# Patient Record
Sex: Male | Born: 1995 | Race: Black or African American | Hispanic: No | Marital: Single | Smoking: Never smoker
Health system: Southern US, Community
[De-identification: ages and names within clinical notes are randomized; demographics above are authoritative.]

---

## 2019-11-19 ENCOUNTER — Other Ambulatory Visit: Payer: Self-pay

## 2019-11-19 ENCOUNTER — Encounter (HOSPITAL_COMMUNITY): Payer: Self-pay | Admitting: *Deleted

## 2019-11-19 ENCOUNTER — Inpatient Hospital Stay (HOSPITAL_COMMUNITY)
Admission: EM | Admit: 2019-11-19 | Discharge: 2019-11-24 | DRG: 566 | Disposition: A | Discharge status: Home / Self Care | Payer: BC Managed Care – PPO | Attending: Internal Medicine | Admitting: Internal Medicine

## 2019-11-19 DIAGNOSIS — R823 Hemoglobinuria: Secondary | ICD-10-CM | POA: Diagnosis present

## 2019-11-19 DIAGNOSIS — Z20822 Contact with and (suspected) exposure to covid-19: Secondary | ICD-10-CM | POA: Diagnosis present

## 2019-11-19 DIAGNOSIS — T796XXA Traumatic ischemia of muscle, initial encounter: Principal | ICD-10-CM | POA: Diagnosis present

## 2019-11-19 DIAGNOSIS — X503XXA Overexertion from repetitive movements, initial encounter: Secondary | ICD-10-CM

## 2019-11-19 DIAGNOSIS — R7401 Elevation of levels of liver transaminase levels: Secondary | ICD-10-CM | POA: Diagnosis present

## 2019-11-19 DIAGNOSIS — X500XXA Overexertion from strenuous movement or load, initial encounter: Secondary | ICD-10-CM

## 2019-11-19 DIAGNOSIS — R7989 Other specified abnormal findings of blood chemistry: Secondary | ICD-10-CM

## 2019-11-19 DIAGNOSIS — M79604 Pain in right leg: Secondary | ICD-10-CM

## 2019-11-19 DIAGNOSIS — Z807 Family history of other malignant neoplasms of lymphoid, hematopoietic and related tissues: Secondary | ICD-10-CM

## 2019-11-19 DIAGNOSIS — M6282 Rhabdomyolysis: Secondary | ICD-10-CM | POA: Diagnosis not present

## 2019-11-19 DIAGNOSIS — R809 Proteinuria, unspecified: Secondary | ICD-10-CM | POA: Diagnosis present

## 2019-11-19 DIAGNOSIS — Y9359 Activity, other involving other sports and athletics played individually: Secondary | ICD-10-CM

## 2019-11-19 DIAGNOSIS — R824 Acetonuria: Secondary | ICD-10-CM | POA: Diagnosis present

## 2019-11-19 LAB — COMPREHENSIVE METABOLIC PANEL
ALT: 168 U/L — ABNORMAL HIGH (ref 0–44)
AST: 848 U/L — ABNORMAL HIGH (ref 15–41)
Albumin: 4.5 g/dL (ref 3.5–5.0)
Alkaline Phosphatase: 41 U/L (ref 38–126)
Anion gap: 8 (ref 5–15)
BUN: 7 mg/dL (ref 6–20)
CO2: 26 mmol/L (ref 22–32)
Calcium: 9.2 mg/dL (ref 8.9–10.3)
Chloride: 105 mmol/L (ref 98–111)
Creatinine, Ser: 1.1 mg/dL (ref 0.61–1.24)
GFR calc Af Amer: >60 mL/min (ref >60)
GFR calc non Af Amer: >60 mL/min (ref >60)
Glucose, Bld: 92 mg/dL (ref 70–99)
Potassium: 4.1 mmol/L (ref 3.5–5.1)
Sodium: 139 mmol/L (ref 135–145)
Total Bilirubin: 1.1 mg/dL (ref 0.3–1.2)
Total Protein: 7.5 g/dL (ref 6.5–8.1)

## 2019-11-19 LAB — BASIC METABOLIC PANEL
Anion gap: 8 (ref 5–15)
Anion gap: 7 (ref 5–15)
BUN: 6 mg/dL (ref 6–20)
BUN: 6 mg/dL (ref 6–20)
CO2: 24 mmol/L (ref 22–32)
CO2: 24 mmol/L (ref 22–32)
Calcium: 8.2 mg/dL — ABNORMAL LOW (ref 8.9–10.3)
Calcium: 8.4 mg/dL — ABNORMAL LOW (ref 8.9–10.3)
Chloride: 109 mmol/L (ref 98–111)
Chloride: 109 mmol/L (ref 98–111)
Creatinine, Ser: 1.08 mg/dL (ref 0.61–1.24)
Creatinine, Ser: 0.87 mg/dL (ref 0.61–1.24)
GFR calc Af Amer: >60 mL/min (ref >60)
GFR calc Af Amer: >60 mL/min (ref >60)
GFR calc non Af Amer: >60 mL/min (ref >60)
GFR calc non Af Amer: >60 mL/min (ref >60)
Glucose, Bld: 89 mg/dL (ref 70–99)
Glucose, Bld: 102 mg/dL — ABNORMAL HIGH (ref 70–99)
Potassium: 3.7 mmol/L (ref 3.5–5.1)
Potassium: 3.9 mmol/L (ref 3.5–5.1)
Sodium: 141 mmol/L (ref 135–145)
Sodium: 140 mmol/L (ref 135–145)

## 2019-11-19 LAB — CBC WITH DIFFERENTIAL/PLATELET
Abs Immature Granulocytes: 0.01 10*3/uL (ref 0.00–0.07)
Basophils Absolute: 0 10*3/uL (ref 0.0–0.1)
Basophils Relative: 0 %
Eosinophils Absolute: 0 10*3/uL (ref 0.0–0.5)
Eosinophils Relative: 0 %
HCT: 43.1 % (ref 39.0–52.0)
Hemoglobin: 13.2 g/dL (ref 13.0–17.0)
Immature Granulocytes: 0 %
Lymphocytes Relative: 23 %
Lymphs Abs: 1.5 10*3/uL (ref 0.7–4.0)
MCH: 27.9 pg (ref 26.0–34.0)
MCHC: 30.6 g/dL (ref 30.0–36.0)
MCV: 91.1 fL (ref 80.0–100.0)
Monocytes Absolute: 0.7 10*3/uL (ref 0.1–1.0)
Monocytes Relative: 10 %
Neutro Abs: 4.4 10*3/uL (ref 1.7–7.7)
Neutrophils Relative %: 67 %
Platelets: 286 10*3/uL (ref 150–400)
RBC: 4.73 MIL/uL (ref 4.22–5.81)
RDW: 12.7 % (ref 11.5–15.5)
WBC: 6.6 10*3/uL (ref 4.0–10.5)
nRBC: 0 % (ref 0.0–0.2)

## 2019-11-19 LAB — URINALYSIS, ROUTINE W REFLEX MICROSCOPIC
Bilirubin Urine: NEGATIVE
Glucose, UA: NEGATIVE mg/dL
Ketones, ur: 5 mg/dL — AB
Leukocytes,Ua: NEGATIVE
Nitrite: NEGATIVE
Protein, ur: 100 mg/dL — AB
Specific Gravity, Urine: 1.031 — ABNORMAL HIGH (ref 1.005–1.030)
pH: 5 (ref 5.0–8.0)

## 2019-11-19 LAB — CK: Total CK: >50000 U/L — ABNORMAL HIGH (ref 49–397)

## 2019-11-19 LAB — SARS CORONAVIRUS 2 (TAT 6-24 HRS): SARS Coronavirus 2: NEGATIVE

## 2019-11-19 MED ORDER — ENOXAPARIN SODIUM 40 MG/0.4ML ~~LOC~~ SOLN
40.0000 mg | SUBCUTANEOUS | Status: DC
  Administered 2019-11-19: 40 mg via SUBCUTANEOUS
  Filled 2019-11-19: qty 0.4

## 2019-11-19 MED ORDER — ONDANSETRON HCL 4 MG/2ML IJ SOLN: 4.0000 mg | Freq: Four times a day (QID) | INTRAMUSCULAR | Status: DC | PRN

## 2019-11-19 MED ORDER — ONDANSETRON HCL 4 MG/2ML IJ SOLN
4.0000 mg | Freq: Once | INTRAMUSCULAR | Status: AC
  Administered 2019-11-19: 4 mg via INTRAVENOUS

## 2019-11-19 MED ORDER — ONDANSETRON HCL 4 MG/2ML IJ SOLN
INTRAMUSCULAR | Status: AC
  Filled 2019-11-19: qty 2

## 2019-11-19 MED ORDER — SODIUM CHLORIDE 0.9 % IV BOLUS
1000.0000 mL | Freq: Once | INTRAVENOUS | Status: AC
  Administered 2019-11-19: 1000 mL via INTRAVENOUS

## 2019-11-19 MED ORDER — SODIUM CHLORIDE 0.9 % IV BOLUS
2000.0000 mL | Freq: Once | INTRAVENOUS | Status: AC
  Administered 2019-11-19: 2000 mL via INTRAVENOUS

## 2019-11-19 MED ORDER — SODIUM CHLORIDE 0.9 % IV SOLN: INTRAVENOUS | Status: AC

## 2019-11-19 MED ORDER — PANTOPRAZOLE SODIUM 40 MG IV SOLR: 40.0000 mg | INTRAVENOUS | Status: DC

## 2019-11-19 MED ORDER — PANTOPRAZOLE SODIUM 40 MG IV SOLR
40.0000 mg | Freq: Once | INTRAVENOUS | Status: AC
  Administered 2019-11-22: 40 mg via INTRAVENOUS
  Filled 2019-11-19: qty 40

## 2019-11-19 NOTE — ED Notes (Signed)
Patient had x1 emesis, stated he felt nauseated after he tried to eat. Emesis bag and Zofran given.

## 2019-11-19 NOTE — H&P (Signed)
History and Physical    William Le YKD:[id number] DOB: [birth year] DOA: 11/19/2019  PCP: Patient, No Pcp Per   Patient coming from: Home.   I have personally briefly reviewed patient's old medical records in [place]  Chief Complaint: Right eye soreness and decreased urination.  HPI: William Le is a 24 y.o. male with no previous past medical history who is coming to the emergency department due to soreness in his right eye associated with decreased urine output and dark-colored urine.    He has been exercising his lower extremities by lifting weights the past couple days.  He mentions that he has only had 2-3 bottles of water since then. He denies flank pain, dysuria or frequency. He denies fever, chills, sore throat or rhinorrhea.  No dyspnea, wheezing or hemoptysis.  Denies chest pain, palpitations, dizziness, diaphoresis, lower extremity edema.  Denies abdominal pain, diarrhea, constipation, melena or hematochezia.  The patient had an episode of emesis after trying to eat dinner this evening.  ED Course: Initial vital signs temperature 98.2 F, pulse 81, respirations 16, blood pressure 154/39 mmHg and O2 sat 99% on room air.  The patient has been given vigorous IVF resuscitation.  He has urinated twice since being in the emergency department.  His urinalysis is amber in color with a hazy appearance and a specific gravity of 1.0 31, large hemoglobinuria with no RBCs on microscopic examination.  There is ketonuria 5 and proteinuria 100 mg/dL.  Rare bacteria on microscopic examination.  CMP shows an AST of 848 and ALT of 168.  Review of Systems: As per HPI otherwise 10 point review of systems negative.   History reviewed. No pertinent past medical history.  History reviewed. No pertinent surgical history.   reports that he has never smoked. He has never used smokeless tobacco. He reports that he does not drink alcohol or use drugs.  No Known Allergies  Family History   Problem Relation Age of Onset  . Multiple myeloma Father    Prior to Admission medications   Not on File   Physical Exam: Vitals:   11/19/19 1148 11/19/19 1350 11/19/19 1430 11/19/19 1559  BP:  (!) 149/95 (!) 147/78   Pulse:  (!) 110 (!) 51 (!) 58  Resp:  16 18   Temp:      TempSrc:      SpO2:  100% 100% 100%  Weight: 77.1 kg     Height: 6' (1.829 m)       Constitutional: NAD, calm, comfortable Eyes: PERRL, lids and conjunctivae normal ENMT: Mucous membranes are mildly dry. Posterior pharynx clear of any exudate or lesions. Neck: normal, supple, no masses, no thyromegaly Respiratory: clear to auscultation bilaterally, no wheezing, no crackles. Normal respiratory effort. No accessory muscle use.  Cardiovascular: Regular rate and rhythm, no murmurs / rubs / gallops. No extremity edema. 2+ pedal pulses. No carotid bruits.  Abdomen: Soft, no tenderness, no masses palpated. No hepatosplenomegaly. Bowel sounds positive.  Musculoskeletal: Right thigh soreness. no clubbing / cyanosis. Good ROM, no contractures. Normal muscle tone.  Skin: no rashes, lesions, ulcers.  Neurologic: CN 2-12 grossly intact. Sensation intact, DTR normal. Strength 5/5 in all 4.  Psychiatric: Normal judgment and insight. Alert and oriented x 3. Normal mood.   Labs on Admission: I have personally reviewed following labs and imaging studies  CBC: Recent Labs  Lab 11/19/19 1359  WBC 6.6  NEUTROABS 4.4  HGB 13.2  HCT 43.1  MCV 91.1  PLT 286  Basic Metabolic Panel: Recent Labs  Lab 11/19/19 1223  NA 139  K 4.1  CL 105  CO2 26  GLUCOSE 92  BUN 7  CREATININE 1.10  CALCIUM 9.2   GFR: Estimated Creatinine Clearance: 113.9 mL/min (by C-G formula based on SCr of 1.1 mg/dL). Liver Function Tests: Recent Labs  Lab 11/19/19 1223  AST 848*  ALT 168*  ALKPHOS 41  BILITOT 1.1  PROT 7.5  ALBUMIN 4.5   No results for input(s): LIPASE, AMYLASE in the last 168 hours. No results for input(s):  AMMONIA in the last 168 hours. Coagulation Profile: No results for input(s): INR, PROTIME in the last 168 hours. Cardiac Enzymes: Recent Labs  Lab 11/19/19 1223  CKTOTAL >50,000*   BNP (last 3 results) No results for input(s): PROBNP in the last 8760 hours. HbA1C: No results for input(s): HGBA1C in the last 72 hours. CBG: No results for input(s): GLUCAP in the last 168 hours. Lipid Profile: No results for input(s): CHOL, HDL, LDLCALC, TRIG, CHOLHDL, LDLDIRECT in the last 72 hours. Thyroid Function Tests: No results for input(s): TSH, T4TOTAL, FREET4, T3FREE, THYROIDAB in the last 72 hours. Anemia Panel: No results for input(s): VITAMINB12, FOLATE, FERRITIN, TIBC, IRON, RETICCTPCT in the last 72 hours. Urine analysis:    Component Value Date/Time   COLORURINE AMBER (A) 11/19/2019 1153   APPEARANCEUR HAZY (A) 11/19/2019 1153   LABSPEC 1.031 (H) 11/19/2019 1153   PHURINE 5.0 11/19/2019 1153   GLUCOSEU NEGATIVE 11/19/2019 1153   HGBUR LARGE (A) 11/19/2019 1153   BILIRUBINUR NEGATIVE 11/19/2019 1153   KETONESUR 5 (A) 11/19/2019 1153   PROTEINUR 100 (A) 11/19/2019 1153   NITRITE NEGATIVE 11/19/2019 1153   LEUKOCYTESUR NEGATIVE 11/19/2019 1153    Radiological Exams on Admission: No results found.  EKG: Independently reviewed.  Assessment/Plan Principal Problem:   Rhabdomyolysis Has urinated twice in the ED. Observation/MedSurg. Received 4000 mL NS bolus. Continue NS at 200 mL/h. Monitor intake and output. Follow renal function closely. Follow-up total CK and transaminases. Furosemide IV as needed for volume overload. Close telemetry or stepdown monitoring if urine alkalinization needed.  Active Problems:   Transaminitis Likely secondary to rhabdomyolysis. Follow transaminases along with CK daily.   DVT prophylaxis: Lovenox SQ. Code Status: Full code. Family Communication: Disposition Plan: Observation for IV hydration. Consults called:  Admission status:  Observation/MedSurg.   Reubin Milan MD [facility]  If 7PM-7AM, please contact night-coverage [email or web address]  11/19/2019, 4:05 PM   This document was prepared using Dragon voice recognition software and may contain some unintended transcription errors.

## 2019-11-19 NOTE — Plan of Care (Signed)

## 2019-11-19 NOTE — ED Triage Notes (Signed)
Pt states he has been working out the last couple of days, has soreness in rt thigh. States his "pee is a little darker since working out" only drinking 2-3 bottles of water a day.

## 2019-11-19 NOTE — ED Provider Notes (Signed)
[facility]-EMERGENCY DEPT Provider Note   CSN: [id number] Arrival date & time: 11/19/19  1137     History Chief Complaint  Patient presents with  . Leg Pain    William Le is a 24 y.o. male.  HPI Patient resents today with bilateral lower extremity pain in his lateral thighs.  He states that 3 days ago he did a heavy labor work with leg presses and squats and ended the same workout yesterday.  He states over this period of time he drank minimal amount of water.  He states that he woke up this morning with severe lower extremity pain which is achy, crampy, worse with touch and movement.  He states that when he went to the bathroom he noticed that his urine is very dark.  Patient states he has no history of rhabdomyolysis.  No trauma.  Denies any nausea, vomiting, belly pain.  He denies any Tylenol use.  No diarrhea.  No known sick contacts.     History reviewed. No pertinent past medical history.  There are no problems to display for this patient.   History reviewed. No pertinent surgical history.     No family history on file.  Social History   Tobacco Use  . Smoking status: Never Smoker  . Smokeless tobacco: Never Used  Substance Use Topics  . Alcohol use: Never  . Drug use: Never    Home Medications Prior to Admission medications   Not on File    Allergies    Patient has no known allergies.  Review of Systems   Review of Systems  Constitutional: Negative for chills and fever.  HENT: Negative for congestion.   Eyes: Negative for pain.  Respiratory: Negative for cough and shortness of breath.   Cardiovascular: Negative for chest pain and leg swelling.  Gastrointestinal: Negative for abdominal pain and vomiting.  Genitourinary: Negative for dysuria.       Dark urine  Musculoskeletal: Positive for myalgias.       Bilateral leg pain  Skin: Negative for rash.  Neurological: Negative for dizziness and headaches.    Physical  Exam Updated Vital Signs BP (!) 149/95 (BP Location: Left Arm)   Pulse (!) 110   Temp 98.2 F (36.8 C) (Oral)   Resp 16   Ht 6' (1.829 m)   Wt 77.1 kg   SpO2 100%   BMI 23.06 kg/m   Physical Exam Vitals and nursing note reviewed.  Constitutional:      General: He is not in acute distress.    Appearance: Normal appearance. He is not ill-appearing.     Comments: Well-appearing in no acute distress.  HENT:     Head: Normocephalic and atraumatic.     Nose: Nose normal.  Eyes:     General: No scleral icterus.       Right eye: No discharge.        Left eye: No discharge.     Conjunctiva/sclera: Conjunctivae normal.  Cardiovascular:     Rate and Rhythm: Normal rate and regular rhythm.     Pulses: Normal pulses.     Heart sounds: Normal heart sounds.  Pulmonary:     Effort: Pulmonary effort is normal. No respiratory distress.     Breath sounds: No stridor. No wheezing.  Abdominal:     Palpations: Abdomen is soft.     Tenderness: There is no abdominal tenderness. There is no right CVA tenderness, left CVA tenderness, guarding or rebound.  Musculoskeletal:  Cervical back: Normal range of motion.     Right lower leg: No edema.     Left lower leg: No edema.     Comments: Tenderness to palpation diffusely of the lateral anterior and medial thighs.  No calf tenderness.  Strength 5/5 in flexion extension left ankle, knee, hip bilaterally.  Skin:    General: Skin is warm and dry.     Capillary Refill: Capillary refill takes less than 2 seconds.  Neurological:     Mental Status: He is alert and oriented to person, place, and time. Mental status is at baseline.  Psychiatric:        Mood and Affect: Mood normal.        Behavior: Behavior normal.     ED Results / Procedures / Treatments   Labs (all labs ordered are listed, but only abnormal results are displayed) Labs Reviewed  URINALYSIS, ROUTINE W REFLEX MICROSCOPIC - Abnormal; Notable for the following components:       Result Value   Color, Urine AMBER (*)    APPearance HAZY (*)    Specific Gravity, Urine 1.031 (*)    Hgb urine dipstick LARGE (*)    Ketones, ur 5 (*)    Protein, ur 100 (*)    Bacteria, UA RARE (*)    All other components within normal limits  CK - Abnormal; Notable for the following components:   Total CK >50,000 (*)    All other components within normal limits  COMPREHENSIVE METABOLIC PANEL - Abnormal; Notable for the following components:   AST 848 (*)    ALT 168 (*)    All other components within normal limits  SARS CORONAVIRUS 2 (TAT 6-24 HRS)  CBC WITH DIFFERENTIAL/PLATELET    EKG None  Radiology No results found.  Procedures Procedures (including critical care time)  Medications Ordered in ED Medications  sodium chloride 0.9 % bolus 1,000 mL (has no administration in time range)  sodium chloride 0.9 % bolus 1,000 mL (1,000 mLs Intravenous New Bag/Given 11/19/19 1421)    ED Course  I have reviewed the triage vital signs and the nursing notes.  Pertinent labs & imaging results that were available during my care of the patient were reviewed by me and considered in my medical decision making (see chart for details).    MDM Rules/Calculators/A&P                      Patient is well-appearing 24 year old male presents today with bilateral thigh pain.  He did intense lower extremity workouts 2 days in row and then woke up this morning and noticed dark urine and worsening leg pain.  He denies any other symptoms today.  Physical exam is relatively unremarkable apart from diffuse muscular tenderness of the thighs.  He has strength and sensation intact and good pulses.  CK greater than 50,000.  CMP notable for transaminitis.  No Tylenol use and no alcohol use.  He denies any sick contacts or diarrhea with indicative of hepatitis causing his transaminitis.  Suspect that this is secondary to rhabdomyolysis.  CBC unremarkable.  Urinalysis shows dark-colored hazy appearance  with specific gravity large hemoglobin, ketones and protein.  No significant evidence of infection.  pH is within normal limits.  No negation for urinary alkalinization.  William Le was evaluated in Emergency Department on 11/19/2019 for the symptoms described in the history of present illness. He was evaluated in the context of the global COVID-19 pandemic, which necessitated consideration  that the patient might be at risk for infection with the SARS-CoV-2 virus that causes COVID-19. Institutional protocols and algorithms that pertain to the evaluation of patients at risk for COVID-19 are in a state of rapid change based on information released by regulatory bodies including the CDC and federal and state organizations. These policies and algorithms were followed during the patient's care in the ED  Patient will be admitted to the hospitalist.  2 L of normal saline given IV.    Final Clinical Impression(s) / ED Diagnoses Final diagnoses:  Bilateral leg pain  Non-traumatic rhabdomyolysis    Rx / DC Orders ED Discharge Orders    None       Tedd Sias, [place] 11/19/19 1431    Dorie Rank, MD 11/20/19 [phone]

## 2019-11-20 ENCOUNTER — Inpatient Hospital Stay (HOSPITAL_COMMUNITY): Payer: BC Managed Care – PPO

## 2019-11-20 DIAGNOSIS — R7989 Other specified abnormal findings of blood chemistry: Secondary | ICD-10-CM | POA: Diagnosis not present

## 2019-11-20 DIAGNOSIS — M79604 Pain in right leg: Secondary | ICD-10-CM | POA: Diagnosis not present

## 2019-11-20 DIAGNOSIS — Z807 Family history of other malignant neoplasms of lymphoid, hematopoietic and related tissues: Secondary | ICD-10-CM | POA: Diagnosis not present

## 2019-11-20 DIAGNOSIS — Z20822 Contact with and (suspected) exposure to covid-19: Secondary | ICD-10-CM | POA: Diagnosis present

## 2019-11-20 DIAGNOSIS — T796XXA Traumatic ischemia of muscle, initial encounter: Secondary | ICD-10-CM | POA: Diagnosis present

## 2019-11-20 DIAGNOSIS — M79605 Pain in left leg: Secondary | ICD-10-CM

## 2019-11-20 DIAGNOSIS — X500XXA Overexertion from strenuous movement or load, initial encounter: Secondary | ICD-10-CM | POA: Diagnosis not present

## 2019-11-20 DIAGNOSIS — R809 Proteinuria, unspecified: Secondary | ICD-10-CM | POA: Diagnosis present

## 2019-11-20 DIAGNOSIS — Y9359 Activity, other involving other sports and athletics played individually: Secondary | ICD-10-CM | POA: Diagnosis not present

## 2019-11-20 DIAGNOSIS — X503XXA Overexertion from repetitive movements, initial encounter: Secondary | ICD-10-CM | POA: Diagnosis not present

## 2019-11-20 DIAGNOSIS — R824 Acetonuria: Secondary | ICD-10-CM | POA: Diagnosis present

## 2019-11-20 DIAGNOSIS — M6282 Rhabdomyolysis: Secondary | ICD-10-CM | POA: Diagnosis present

## 2019-11-20 DIAGNOSIS — R7401 Elevation of levels of liver transaminase levels: Secondary | ICD-10-CM | POA: Diagnosis not present

## 2019-11-20 DIAGNOSIS — R823 Hemoglobinuria: Secondary | ICD-10-CM | POA: Diagnosis present

## 2019-11-20 LAB — COMPREHENSIVE METABOLIC PANEL
ALT: 180 U/L — ABNORMAL HIGH (ref 0–44)
AST: 808 U/L — ABNORMAL HIGH (ref 15–41)
Albumin: 3.3 g/dL — ABNORMAL LOW (ref 3.5–5.0)
Alkaline Phosphatase: 33 U/L — ABNORMAL LOW (ref 38–126)
Anion gap: 6 (ref 5–15)
BUN: 7 mg/dL (ref 6–20)
CO2: 25 mmol/L (ref 22–32)
Calcium: 8.3 mg/dL — ABNORMAL LOW (ref 8.9–10.3)
Chloride: 109 mmol/L (ref 98–111)
Creatinine, Ser: 0.99 mg/dL (ref 0.61–1.24)
GFR calc Af Amer: >60 mL/min (ref >60)
GFR calc non Af Amer: >60 mL/min (ref >60)
Glucose, Bld: 107 mg/dL — ABNORMAL HIGH (ref 70–99)
Potassium: 3.7 mmol/L (ref 3.5–5.1)
Sodium: 140 mmol/L (ref 135–145)
Total Bilirubin: 0.9 mg/dL (ref 0.3–1.2)
Total Protein: 5.6 g/dL — ABNORMAL LOW (ref 6.5–8.1)

## 2019-11-20 LAB — CBC
HCT: 35.8 % — ABNORMAL LOW (ref 39.0–52.0)
Hemoglobin: 11 g/dL — ABNORMAL LOW (ref 13.0–17.0)
MCH: 27.9 pg (ref 26.0–34.0)
MCHC: 30.7 g/dL (ref 30.0–36.0)
MCV: 90.9 fL (ref 80.0–100.0)
Platelets: 244 10*3/uL (ref 150–400)
RBC: 3.94 MIL/uL — ABNORMAL LOW (ref 4.22–5.81)
RDW: 12.7 % (ref 11.5–15.5)
WBC: 6.4 10*3/uL (ref 4.0–10.5)
nRBC: 0 % (ref 0.0–0.2)

## 2019-11-20 LAB — RAPID URINE DRUG SCREEN, HOSP PERFORMED
Amphetamines: NOT DETECTED
Barbiturates: NOT DETECTED
Benzodiazepines: NOT DETECTED
Cocaine: NOT DETECTED
Opiates: NOT DETECTED
Tetrahydrocannabinol: NOT DETECTED

## 2019-11-20 LAB — CK: Total CK: >50000 U/L — ABNORMAL HIGH (ref 49–397)

## 2019-11-20 LAB — ETHANOL: Alcohol, Ethyl (B): <10 mg/dL (ref <10)

## 2019-11-20 LAB — HEPATITIS PANEL, ACUTE
HCV Ab: NONREACTIVE
Hep A IgM: NONREACTIVE
Hep B C IgM: NONREACTIVE
Hepatitis B Surface Ag: NONREACTIVE

## 2019-11-20 LAB — HIV ANTIBODY (ROUTINE TESTING W REFLEX): HIV Screen 4th Generation wRfx: NONREACTIVE

## 2019-11-20 NOTE — Plan of Care (Signed)

## 2019-11-20 NOTE — Progress Notes (Signed)
PROGRESS NOTE    William Le  PYK:[id number] DOB: [birth year] DOA: 11/19/2019 PCP: Patient, No Pcp Per    Brief Narrative:24 y.o. male with no previous past medical history who is coming to the emergency department due to soreness in his right eye associated with decreased urine output and dark-colored urine.    He has been exercising his lower extremities by lifting weights the past couple days.  He mentions that he has only had 2-3 bottles of water since then. He denies flank pain, dysuria or frequency. He denies fever, chills, sore throat or rhinorrhea.  No dyspnea, wheezing or hemoptysis.  Denies chest pain, palpitations, dizziness, diaphoresis, lower extremity edema.  Denies abdominal pain, diarrhea, constipation, melena or hematochezia.  The patient had an episode of emesis after trying to eat dinner this evening.  ED Course: Initial vital signs temperature 98.2 F, pulse 81, respirations 16, blood pressure 154/39 mmHg and O2 sat 99% on room air.  The patient has been given vigorous IVF resuscitation.  He has urinated twice since being in the emergency department.  His urinalysis is amber in color with a hazy appearance and a specific gravity of 1.0 31, large hemoglobinuria with no RBCs on microscopic examination.  There is ketonuria 5 and proteinuria 100 mg/dL.  Rare bacteria on microscopic examination.  CMP shows an AST of 848 and ALT of 168.  Assessment & Plan:   Principal Problem:   Rhabdomyolysis Active Problems:   Transaminitis   #1Rhabdomyolysis-secondary to heavy exercise.  His renal functions fortunately is stable.  However his CPK is still elevated more than 50,000.  He still needs ongoing IV hydration continue fluids at 200 cc an hour.  Follow-up labs in a.m. Out of bed  #2 transaminitis AST 88 up from 848 ALT 180 up from 168 Total bilirubin 0.9 Albumin 3.3 Check ultrasound of the liver and gallbladder Check hepatitis panel Urine drug screen Alcohol  level    Estimated body mass index is 23.06 kg/m as calculated from the following:   Height as of this encounter: 6' (1.829 m).   Weight as of this encounter: 77.1 kg.  DVT prophylaxis: Lovenox  code Status: Full code  family Communication: None  disposition Plan: Patient came from home, plan will be to discharge home.  Barriers to discharge-still with active rhabdomyolysis with more than 50,000 CPK and elevated transaminitis, needs IV fluids and close follow-up of renal functions and liver functions. Consultants:   None  Procedures: None Antimicrobials: None  Subjective: Resting in bed in no acute distress urine is still dark lightheaded on standing no nausea vomiting on IV fluids at 200 cc an hour  Objective: Vitals:   11/19/19 1730 11/19/19 1845 11/19/19 1927 11/20/19 0527  BP: (!) 150/96 132/60 (!) 151/56 138/74  Pulse: 75 66 73 73  Resp: 17 17 16 18   Temp:  97.8 F (36.6 C) 98.7 F (37.1 C) 98.1 F (36.7 C)  TempSrc:  Oral Oral Oral  SpO2: 100% 100% 100% 100%  Weight:      Height:        Intake/Output Summary (Last 24 hours) at 11/20/2019 1203 Last data filed at 11/20/2019 1141 Gross per 24 hour  Intake 7147.85 ml  Output 2650 ml  Net 4497.85 ml   Filed Weights   11/19/19 1148  Weight: 77.1 kg    Examination:  General exam: Appears in no acute distress  respiratory system: Clear to auscultation. Respiratory effort normal. Cardiovascular system: S1 & S2 heard, RRR. No JVD,  murmurs, rubs, gallops or clicks. No pedal edema. Gastrointestinal system: Abdomen is nondistended, soft and nontender. No organomegaly or masses felt. Normal bowel sounds heard. Central nervous system: Alert and oriented. No focal neurological deficits. Extremities: Symmetric 5 x 5 power. Skin: No rashes, lesions or ulcers Psychiatry: Judgement and insight appear normal. Mood & affect appropriate.     Data Reviewed: I have personally reviewed following labs and imaging  studies  CBC: Recent Labs  Lab 11/19/19 1359 11/20/19 0339  WBC 6.6 6.4  NEUTROABS 4.4  --   HGB 13.2 11.0*  HCT 43.1 35.8*  MCV 91.1 90.9  PLT 286 244   Basic Metabolic Panel: Recent Labs  Lab 11/19/19 1223 11/19/19 1700 11/19/19 2028 11/20/19 0339  NA 139 141 140 140  K 4.1 3.7 3.9 3.7  CL 105 109 109 109  CO2 26 24 24 25   GLUCOSE 92 89 102* 107*  BUN 7 6 6 7   CREATININE 1.10 1.08 0.87 0.99  CALCIUM 9.2 8.2* 8.4* 8.3*   GFR: Estimated Creatinine Clearance: 126.6 mL/min (by C-G formula based on SCr of 0.99 mg/dL). Liver Function Tests: Recent Labs  Lab 11/19/19 1223 11/20/19 0339  AST 848* 808*  ALT 168* 180*  ALKPHOS 41 33*  BILITOT 1.1 0.9  PROT 7.5 5.6*  ALBUMIN 4.5 3.3*   No results for input(s): LIPASE, AMYLASE in the last 168 hours. No results for input(s): AMMONIA in the last 168 hours. Coagulation Profile: No results for input(s): INR, PROTIME in the last 168 hours. Cardiac Enzymes: Recent Labs  Lab 11/19/19 1223 11/20/19 0339  CKTOTAL >50,000* >50,000*   BNP (last 3 results) No results for input(s): PROBNP in the last 8760 hours. HbA1C: No results for input(s): HGBA1C in the last 72 hours. CBG: No results for input(s): GLUCAP in the last 168 hours. Lipid Profile: No results for input(s): CHOL, HDL, LDLCALC, TRIG, CHOLHDL, LDLDIRECT in the last 72 hours. Thyroid Function Tests: No results for input(s): TSH, T4TOTAL, FREET4, T3FREE, THYROIDAB in the last 72 hours. Anemia Panel: No results for input(s): VITAMINB12, FOLATE, FERRITIN, TIBC, IRON, RETICCTPCT in the last 72 hours. Sepsis Labs: No results for input(s): PROCALCITON, LATICACIDVEN in the last 168 hours.  Recent Results (from the past 240 hour(s))  SARS CORONAVIRUS 2 (TAT 6-24 HRS) Nasopharyngeal Nasopharyngeal Swab     Status: None   Collection Time: 11/19/19  1:59 PM   Specimen: Nasopharyngeal Swab  Result Value Ref Range Status   SARS Coronavirus 2 NEGATIVE NEGATIVE Final     Comment: (NOTE) SARS-CoV-2 target nucleic acids are NOT DETECTED. The SARS-CoV-2 RNA is generally detectable in upper and lower respiratory specimens during the acute phase of infection. Negative results do not preclude SARS-CoV-2 infection, do not rule out co-infections with other pathogens, and should not be used as the sole basis for treatment or other patient management decisions. Negative results must be combined with clinical observations, patient history, and epidemiological information. The expected result is Negative. Fact Sheet for Patients: 01/20/20 Fact Sheet for Healthcare Providers: 01/19/20 This test is not yet approved or cleared by the [employer] FDA and  has been authorized for detection and/or diagnosis of SARS-CoV-2 by FDA under an Emergency Use Authorization (EUA). This EUA will remain  in effect (meaning this test can be used) for the duration of the COVID-19 declaration under Section 56 4(b)(1) of the Act, 21 U.S.C. section 360bbb-3(b)(1), unless the authorization is terminated or revoked sooner. Performed at [facility], [address], [place], [place] [address]          Radiology Studies: No results found.      Scheduled Meds: . enoxaparin (LOVENOX) injection  40 mg Subcutaneous Q24H  . pantoprazole (PROTONIX) IV  40 mg Intravenous Once   Continuous Infusions: . sodium chloride 200 mL/hr at 11/20/19 0800     LOS: 0 days     Georgette Shell, MD 11/20/2019, 12:03 PM

## 2019-11-21 LAB — COMPREHENSIVE METABOLIC PANEL
ALT: 268 U/L — ABNORMAL HIGH (ref 0–44)
AST: 1144 U/L — ABNORMAL HIGH (ref 15–41)
Albumin: 3.6 g/dL (ref 3.5–5.0)
Alkaline Phosphatase: 38 U/L (ref 38–126)
Anion gap: 5 (ref 5–15)
BUN: 8 mg/dL (ref 6–20)
CO2: 27 mmol/L (ref 22–32)
Calcium: 8.6 mg/dL — ABNORMAL LOW (ref 8.9–10.3)
Chloride: 108 mmol/L (ref 98–111)
Creatinine, Ser: 0.92 mg/dL (ref 0.61–1.24)
GFR calc Af Amer: >60 mL/min (ref >60)
GFR calc non Af Amer: >60 mL/min (ref >60)
Glucose, Bld: 96 mg/dL (ref 70–99)
Potassium: 4 mmol/L (ref 3.5–5.1)
Sodium: 140 mmol/L (ref 135–145)
Total Bilirubin: 0.6 mg/dL (ref 0.3–1.2)
Total Protein: 6.2 g/dL — ABNORMAL LOW (ref 6.5–8.1)

## 2019-11-21 LAB — CBC
HCT: 36.5 % — ABNORMAL LOW (ref 39.0–52.0)
Hemoglobin: 11.1 g/dL — ABNORMAL LOW (ref 13.0–17.0)
MCH: 27.7 pg (ref 26.0–34.0)
MCHC: 30.4 g/dL (ref 30.0–36.0)
MCV: 91 fL (ref 80.0–100.0)
Platelets: 264 10*3/uL (ref 150–400)
RBC: 4.01 MIL/uL — ABNORMAL LOW (ref 4.22–5.81)
RDW: 12.6 % (ref 11.5–15.5)
WBC: 5.3 10*3/uL (ref 4.0–10.5)
nRBC: 0 % (ref 0.0–0.2)

## 2019-11-21 LAB — CK: Total CK: >50000 U/L — ABNORMAL HIGH (ref 49–397)

## 2019-11-21 MED ORDER — SODIUM CHLORIDE 0.9 % IV SOLN: INTRAVENOUS | Status: DC

## 2019-11-21 NOTE — Progress Notes (Signed)
PROGRESS NOTE    William Le  HEN:[id number] DOB: [birth year] DOA: 11/19/2019 PCP: Patient, No Pcp Per   Brief Narrative: 23 y.o.malewithno previous past medical historywho is coming to the emergency department due to soreness in his right eye associated with decreased urine output and dark-colored urine. He has been exercising his lower extremities by lifting weights the past couple days. He mentions that he has only had 2-3 bottles of water since then. He denies flank pain, dysuria or frequency. He denies fever, chills, sore throat or rhinorrhea. No dyspnea, wheezing or hemoptysis. Denies chest pain, palpitations, dizziness, diaphoresis, lower extremity edema. Denies abdominal pain, diarrhea, constipation, melena or hematochezia. The patient had an episode of emesis after trying to eat dinner this evening.  ED Course:Initial vital signs temperature 98.2 F, pulse 81, respirations 16, blood pressure 154/39 mmHg and O2 sat 99% on room air. The patient has been given vigorous IVF resuscitation. He has urinated twice since being in the emergency department.  His urinalysis is amber in color with a hazy appearance and a specific gravity of 1.0 31, large hemoglobinuria with no RBCs on microscopic examination. There is ketonuria 5 and proteinuria 100 mg/dL. Rare bacteria on microscopic examination. CMP shows an AST of 848 and ALT of 168.  Assessment & Plan:   Principal Problem:   Rhabdomyolysis Active Problems:   Transaminitis   Bilateral leg pain   Elevated LFTs   #1Rhabdomyolysis-secondary to heavy exercise.  His renal functions fortunately is stable.  However his CPK is still elevated more than 50,000.  He still needs ongoing IV hydration.  Increase fluids to 400 cc an hour.  Follow-up labs in a.m.  #2 transaminitis-likely secondary to rhabdomyolysis.  AST 1144 from 88 up from 848 ALT to 68 from 180 up from 168 Total bilirubin 0.6 Albumin 3.6 Check ultrasound of the  liver and gallbladder-marked circumferential gallbladder wall thickening likely due to systemic causes no gallstones no biliary dilatation normal-appearing liver. Check hepatitis panel negative Urine drug screen negative, alcohol level negative  Estimated body mass index is 23.06 kg/m as calculated from the following:   Height as of this encounter: 6' (1.829 m).   Weight as of this encounter: 77.1 kg.  DVT prophylaxis: Lovenox  code Status: Full code  family Communication: None  disposition Plan: Patient came from home, plan will be to discharge home.  Barriers to discharge-still with active rhabdomyolysis with more than 50,000 CPK and elevated transaminitis, needs IV fluids and close follow-up of renal functions and liver functions. Consultants:   None  Procedures: None Antimicrobials: None  Subjective:  No new complaints no nausea vomiting abdominal pain diarrhea able to tolerate p.o. intake Objective: Vitals:   11/20/19 2228 11/21/19 0604 11/21/19 0644 11/21/19 1349  BP: (!) 147/66 115/65  117/83  Pulse: 65 (!) 47 64 71  Resp: 18 18  15   Temp: 98.1 F (36.7 C) 97.6 F (36.4 C)  98.7 F (37.1 C)  TempSrc: Oral Oral    SpO2: 100% 100%  100%  Weight:      Height:        Intake/Output Summary (Last 24 hours) at 11/21/2019 1355 Last data filed at 11/21/2019 1348 Gross per 24 hour  Intake 4034.05 ml  Output 6450 ml  Net -2415.95 ml   Filed Weights   11/19/19 1148  Weight: 77.1 kg    Examination:  General exam: Appears calm and comfortable  Respiratory system: Clear to auscultation. Respiratory effort normal. Cardiovascular system: S1 & S2 heard,  RRR. No JVD, murmurs, rubs, gallops or clicks. No pedal edema. Gastrointestinal system: Abdomen is nondistended, soft and nontender. No organomegaly or masses felt. Normal bowel sounds heard. Central nervous system: Alert and oriented. No focal neurological deficits. Extremities: Symmetric 5 x 5 power. Skin: No rashes,  lesions or ulcers Psychiatry: Judgement and insight appear normal. Mood & affect appropriate.     Data Reviewed: I have personally reviewed following labs and imaging studies  CBC: Recent Labs  Lab 11/19/19 1359 11/20/19 0339 11/21/19 0240  WBC 6.6 6.4 5.3  NEUTROABS 4.4  --   --   HGB 13.2 11.0* 11.1*  HCT 43.1 35.8* 36.5*  MCV 91.1 90.9 91.0  PLT 286 244 264   Basic Metabolic Panel: Recent Labs  Lab 11/19/19 1223 11/19/19 1700 11/19/19 2028 11/20/19 0339 11/21/19 0240  NA 139 141 140 140 140  K 4.1 3.7 3.9 3.7 4.0  CL 105 109 109 109 108  CO2 26 24 24 25 27   GLUCOSE 92 89 102* 107* 96  BUN 7 6 6 7 8   CREATININE 1.10 1.08 0.87 0.99 0.92  CALCIUM 9.2 8.2* 8.4* 8.3* 8.6*   GFR: Estimated Creatinine Clearance: 136.2 mL/min (by C-G formula based on SCr of 0.92 mg/dL). Liver Function Tests: Recent Labs  Lab 11/19/19 1223 11/20/19 0339 11/21/19 0240  AST 848* 808* 1,144*  ALT 168* 180* 268*  ALKPHOS 41 33* 38  BILITOT 1.1 0.9 0.6  PROT 7.5 5.6* 6.2*  ALBUMIN 4.5 3.3* 3.6   No results for input(s): LIPASE, AMYLASE in the last 168 hours. No results for input(s): AMMONIA in the last 168 hours. Coagulation Profile: No results for input(s): INR, PROTIME in the last 168 hours. Cardiac Enzymes: Recent Labs  Lab 11/19/19 1223 11/20/19 0339 11/21/19 0240  CKTOTAL >50,000* >50,000* >50,000*   BNP (last 3 results) No results for input(s): PROBNP in the last 8760 hours. HbA1C: No results for input(s): HGBA1C in the last 72 hours. CBG: No results for input(s): GLUCAP in the last 168 hours. Lipid Profile: No results for input(s): CHOL, HDL, LDLCALC, TRIG, CHOLHDL, LDLDIRECT in the last 72 hours. Thyroid Function Tests: No results for input(s): TSH, T4TOTAL, FREET4, T3FREE, THYROIDAB in the last 72 hours. Anemia Panel: No results for input(s): VITAMINB12, FOLATE, FERRITIN, TIBC, IRON, RETICCTPCT in the last 72 hours. Sepsis Labs: No results for input(s):  PROCALCITON, LATICACIDVEN in the last 168 hours.  Recent Results (from the past 240 hour(s))  SARS CORONAVIRUS 2 (TAT 6-24 HRS) Nasopharyngeal Nasopharyngeal Swab     Status: None   Collection Time: 11/19/19  1:59 PM   Specimen: Nasopharyngeal Swab  Result Value Ref Range Status   SARS Coronavirus 2 NEGATIVE NEGATIVE Final    Comment: (NOTE) SARS-CoV-2 target nucleic acids are NOT DETECTED. The SARS-CoV-2 RNA is generally detectable in upper and lower respiratory specimens during the acute phase of infection. Negative results do not preclude SARS-CoV-2 infection, do not rule out co-infections with other pathogens, and should not be used as the sole basis for treatment or other patient management decisions. Negative results must be combined with clinical observations, patient history, and epidemiological information. The expected result is Negative. Fact Sheet for Patients: 01/21/20 Fact Sheet for Healthcare Providers: 01/19/20 This test is not yet approved or cleared by the [employer] FDA and  has been authorized for detection and/or diagnosis of SARS-CoV-2 by FDA under an Emergency Use Authorization (EUA). This EUA will remain  in effect (meaning this test can be used) for  the duration of the COVID-19 declaration under Section 56 4(b)(1) of the Act, 21 U.S.C. section 360bbb-3(b)(1), unless the authorization is terminated or revoked sooner. Performed at [facility], [place] [address], [place], [place] [address]          Radiology Studies: US Abdomen Limited RUQ  Result Date: 11/20/2019 CLINICAL DATA:  Elevated LFTs.  Rhabdomyolysis. EXAM: ULTRASOUND ABDOMEN LIMITED RIGHT UPPER QUADRANT COMPARISON:  None. FINDINGS: Gallbladder: Partially distended. Diffuse circumferential gallbladder wall thickening up to 12 mm. No gallstones. No pericholecystic fluid. No sonographic Murphy sign noted by sonographer.  Common bile duct: Diameter: 3 mm, normal. Liver: No focal lesion identified. Within normal limits in parenchymal echogenicity. Portal vein is patent on color Doppler imaging with normal direction of blood flow towards the liver. Other: Trace perihepatic fluid.  Right pleural effusion is noted. IMPRESSION: 1. Marked circumferential gallbladder wall thickening likely due to systemic causes. No gallstones or sonographic findings of acute cholecystitis. No biliary dilatation. 2. Normal sonographic appearance of the liver. 3. Trace perihepatic free fluid.  Right pleural effusion. Electronically Signed   By: Keith Rake M.D.   On: 11/20/2019 18:07        Scheduled Meds: . pantoprazole (PROTONIX) IV  40 mg Intravenous Once   Continuous Infusions: . sodium chloride 400 mL/hr at 11/21/19 1148     LOS: 1 day    Georgette Shell, MD 11/21/2019, 1:55 PM

## 2019-11-22 LAB — COMPREHENSIVE METABOLIC PANEL
ALT: 276 U/L — ABNORMAL HIGH (ref 0–44)
AST: 1062 U/L — ABNORMAL HIGH (ref 15–41)
Albumin: 3.4 g/dL — ABNORMAL LOW (ref 3.5–5.0)
Alkaline Phosphatase: 32 U/L — ABNORMAL LOW (ref 38–126)
Anion gap: 8 (ref 5–15)
BUN: 8 mg/dL (ref 6–20)
CO2: 25 mmol/L (ref 22–32)
Calcium: 8.7 mg/dL — ABNORMAL LOW (ref 8.9–10.3)
Chloride: 106 mmol/L (ref 98–111)
Creatinine, Ser: 0.87 mg/dL (ref 0.61–1.24)
GFR calc Af Amer: >60 mL/min (ref >60)
GFR calc non Af Amer: >60 mL/min (ref >60)
Glucose, Bld: 89 mg/dL (ref 70–99)
Potassium: 4 mmol/L (ref 3.5–5.1)
Sodium: 139 mmol/L (ref 135–145)
Total Bilirubin: 0.6 mg/dL (ref 0.3–1.2)
Total Protein: 5.7 g/dL — ABNORMAL LOW (ref 6.5–8.1)

## 2019-11-22 LAB — CK: Total CK: >50000 U/L — ABNORMAL HIGH (ref 49–397)

## 2019-11-22 NOTE — Plan of Care (Signed)
  Problem: Clinical Measurements: Goal: Ability to maintain clinical measurements within normal limits will improve Outcome: Progressing Goal: Diagnostic test results will improve Outcome: Progressing   Problem: Activity: Goal: Risk for activity intolerance will decrease Outcome: Progressing   Problem: Elimination: Goal: Will not experience complications related to bowel motility Outcome: Progressing   

## 2019-11-22 NOTE — Progress Notes (Signed)
PROGRESS NOTE    William Le  OMV:[id number] DOB: 1996/04/16 DOA: 11/19/2019 PCP: Patient, No Pcp Per    Brief Narrative: 23 y.o.malewithno previous past medical historywho is coming to the emergency department due to soreness in his right eye associated with decreased urine output and dark-colored urine. He has been exercising his lower extremities by lifting weights the past couple days. He mentions that he has only had 2-3 bottles of water since then. He denies flank pain, dysuria or frequency. He denies fever, chills, sore throat or rhinorrhea. No dyspnea, wheezing or hemoptysis. Denies chest pain, palpitations, dizziness, diaphoresis, lower extremity edema. Denies abdominal pain, diarrhea, constipation, melena or hematochezia. The patient had an episode of emesis after trying to eat dinner this evening.  ED Course:Initial vital signs temperature 98.2 F, pulse 81, respirations 16, blood pressure 154/39 mmHg and O2 sat 99% on room air. The patient has been given vigorous IVF resuscitation. He has urinated twice since being in the emergency department.  His urinalysis is amber in color with a hazy appearance and a specific gravity of 1.0 31, large hemoglobinuria with no RBCs on microscopic examination. There is ketonuria 5 and proteinuria 100 mg/dL. Rare bacteria on microscopic examination. CMP shows an AST of 848 and ALT of 168.  Assessment & Plan:   Principal Problem:   Rhabdomyolysis Active Problems:   Transaminitis   Bilateral leg pain   Elevated LFTs  #1Rhabdomyolysis-secondary to heavy exercise. His renal functions fortunately is stable. However his CPK is still elevated more than 50,000.  He still needs ongoing IV hydration.  Continue IV fluids to 50 cc an hour.  His urine has cleared up.    Follow-up labs in a.m.   #2 transaminitis-likely secondary to rhabdomyolysis.  AST  1062 down from 1144 from 88 up from 848 ALT 276 up from 268  Total bilirubin  0.6 Albumin 3.6  ultrasound of the liver and gallbladder-marked circumferential gallbladder wall thickening likely due to systemic causes no gallstones no biliary dilatation normal-appearing liver.  hepatitis panel negative Urine drug screen negative, alcohol level negative    Estimated body mass index is 23.06 kg/m as calculated from the following:   Height as of this encounter: 6' (1.829 m).   Weight as of this encounter: 77.1 kg.  DVT prophylaxis:Lovenox  code Status:Full code  family Communication:None  disposition Plan:Patient came from home, plan will be to discharge home. Barriers to discharge-still with active rhabdomyolysis with more than 50,000 CPK and elevated transaminitis, needs IV fluids and close follow-up of renal functions and liver functions. Consultants:  None  Procedures:None Antimicrobials:None    Subjective: He reports his urine has cleared up no nausea vomiting abdominal pain diarrhea constipation reported  Objective: Vitals:   11/21/19 1349 11/21/19 2249 11/22/19 0535 11/22/19 1347  BP: 117/83 139/62 105/88 132/60  Pulse: 71 62 66 (!) 53  Resp: 15 16 16 16   Temp: 98.7 F (37.1 C) 98.4 F (36.9 C) 98.4 F (36.9 C) 98.5 F (36.9 C)  TempSrc:  Oral Oral Oral  SpO2: 100% 100% 100% 100%  Weight:      Height:        Intake/Output Summary (Last 24 hours) at 11/22/2019 1433 Last data filed at 11/22/2019 1426 Gross per 24 hour  Intake 7623.01 ml  Output 4350 ml  Net 3273.01 ml   Filed Weights   11/19/19 1148  Weight: 77.1 kg    Examination:  General exam: Appears calm and comfortable  Respiratory system: Clear to auscultation.  Respiratory effort normal. Cardiovascular system: S1 & S2 heard, RRR. No JVD, murmurs, rubs, gallops or clicks. No pedal edema. Gastrointestinal system: Abdomen is nondistended, soft and nontender. No organomegaly or masses felt. Normal bowel sounds heard. Central nervous system: Alert and oriented. No focal  neurological deficits. Extremities: Symmetric 5 x 5 power. Skin: No rashes, lesions or ulcers Psychiatry: Judgement and insight appear normal. Mood & affect appropriate.     Data Reviewed: I have personally reviewed following labs and imaging studies  CBC: Recent Labs  Lab 11/19/19 1359 11/20/19 0339 11/21/19 0240  WBC 6.6 6.4 5.3  NEUTROABS 4.4  --   --   HGB 13.2 11.0* 11.1*  HCT 43.1 35.8* 36.5*  MCV 91.1 90.9 91.0  PLT 286 244 413   Basic Metabolic Panel: Recent Labs  Lab 11/19/19 1700 11/19/19 2028 11/20/19 0339 11/21/19 0240 11/22/19 0253  NA 141 140 140 140 139  K 3.7 3.9 3.7 4.0 4.0  CL 109 109 109 108 106  CO2 24 24 25 27 25   GLUCOSE 89 102* 107* 96 89  BUN 6 6 7 8 8   CREATININE 1.08 0.87 0.99 0.92 0.87  CALCIUM 8.2* 8.4* 8.3* 8.6* 8.7*   GFR: Estimated Creatinine Clearance: 144 mL/min (by C-G formula based on SCr of 0.87 mg/dL). Liver Function Tests: Recent Labs  Lab 11/19/19 1223 11/20/19 0339 11/21/19 0240 11/22/19 0253  AST 848* 808* 1,144* 1,062*  ALT 168* 180* 268* 276*  ALKPHOS 41 33* 38 32*  BILITOT 1.1 0.9 0.6 0.6  PROT 7.5 5.6* 6.2* 5.7*  ALBUMIN 4.5 3.3* 3.6 3.4*   No results for input(s): LIPASE, AMYLASE in the last 168 hours. No results for input(s): AMMONIA in the last 168 hours. Coagulation Profile: No results for input(s): INR, PROTIME in the last 168 hours. Cardiac Enzymes: Recent Labs  Lab 11/19/19 1223 11/20/19 0339 11/21/19 0240 11/22/19 0253  CKTOTAL >50,000* >50,000* >50,000* >50,000*   BNP (last 3 results) No results for input(s): PROBNP in the last 8760 hours. HbA1C: No results for input(s): HGBA1C in the last 72 hours. CBG: No results for input(s): GLUCAP in the last 168 hours. Lipid Profile: No results for input(s): CHOL, HDL, LDLCALC, TRIG, CHOLHDL, LDLDIRECT in the last 72 hours. Thyroid Function Tests: No results for input(s): TSH, T4TOTAL, FREET4, T3FREE, THYROIDAB in the last 72 hours. Anemia  Panel: No results for input(s): VITAMINB12, FOLATE, FERRITIN, TIBC, IRON, RETICCTPCT in the last 72 hours. Sepsis Labs: No results for input(s): PROCALCITON, LATICACIDVEN in the last 168 hours.  Recent Results (from the past 240 hour(s))  SARS CORONAVIRUS 2 (TAT 6-24 HRS) Nasopharyngeal Nasopharyngeal Swab     Status: None   Collection Time: 11/19/19  1:59 PM   Specimen: Nasopharyngeal Swab  Result Value Ref Range Status   SARS Coronavirus 2 NEGATIVE NEGATIVE Final    Comment: (NOTE) SARS-CoV-2 target nucleic acids are NOT DETECTED. The SARS-CoV-2 RNA is generally detectable in upper and lower respiratory specimens during the acute phase of infection. Negative results do not preclude SARS-CoV-2 infection, do not rule out co-infections with other pathogens, and should not be used as the sole basis for treatment or other patient management decisions. Negative results must be combined with clinical observations, patient history, and epidemiological information. The expected result is Negative. Fact Sheet for Patients: [employer] Fact Sheet for Healthcare Providers: https://www.[employer].com/ This test is not yet approved or cleared by the [place] FDA and  has been authorized for detection and/or diagnosis of SARS-CoV-2 by FDA  under an Emergency Use Authorization (EUA). This EUA will remain  in effect (meaning this test can be used) for the duration of the COVID-19 declaration under Section 56 4(b)(1) of the Act, 21 U.S.C. section 360bbb-3(b)(1), unless the authorization is terminated or revoked sooner. Performed at [facility], [address], [place], [place] [address]          Radiology Studies: US Abdomen Limited RUQ  Result Date: 11/20/2019 CLINICAL DATA:  Elevated LFTs.  Rhabdomyolysis. EXAM: ULTRASOUND ABDOMEN LIMITED RIGHT UPPER QUADRANT COMPARISON:  None. FINDINGS: Gallbladder: Partially distended. Diffuse  circumferential gallbladder wall thickening up to 12 mm. No gallstones. No pericholecystic fluid. No sonographic Murphy sign noted by sonographer. Common bile duct: Diameter: 3 mm, normal. Liver: No focal lesion identified. Within normal limits in parenchymal echogenicity. Portal vein is patent on color Doppler imaging with normal direction of blood flow towards the liver. Other: Trace perihepatic fluid.  Right pleural effusion is noted. IMPRESSION: 1. Marked circumferential gallbladder wall thickening likely due to systemic causes. No gallstones or sonographic findings of acute cholecystitis. No biliary dilatation. 2. Normal sonographic appearance of the liver. 3. Trace perihepatic free fluid.  Right pleural effusion. Electronically Signed   By: Narda Rutherford M.D.   On: 11/20/2019 18:07        Scheduled Meds: . pantoprazole (PROTONIX) IV  40 mg Intravenous Once   Continuous Infusions: . sodium chloride 250 mL/hr at 11/22/19 1425     LOS: 2 days     Alwyn Ren, MD 11/22/2019, 2:33 PM

## 2019-11-23 LAB — CBC
HCT: 35.1 % — ABNORMAL LOW (ref 39.0–52.0)
Hemoglobin: 10.8 g/dL — ABNORMAL LOW (ref 13.0–17.0)
MCH: 27.9 pg (ref 26.0–34.0)
MCHC: 30.8 g/dL (ref 30.0–36.0)
MCV: 90.7 fL (ref 80.0–100.0)
Platelets: 229 10*3/uL (ref 150–400)
RBC: 3.87 MIL/uL — ABNORMAL LOW (ref 4.22–5.81)
RDW: 12.6 % (ref 11.5–15.5)
WBC: 5.3 10*3/uL (ref 4.0–10.5)
nRBC: 0 % (ref 0.0–0.2)

## 2019-11-23 LAB — COMPREHENSIVE METABOLIC PANEL
ALT: 271 U/L — ABNORMAL HIGH (ref 0–44)
AST: 817 U/L — ABNORMAL HIGH (ref 15–41)
Albumin: 3.4 g/dL — ABNORMAL LOW (ref 3.5–5.0)
Alkaline Phosphatase: 33 U/L — ABNORMAL LOW (ref 38–126)
Anion gap: 6 (ref 5–15)
BUN: 9 mg/dL (ref 6–20)
CO2: 27 mmol/L (ref 22–32)
Calcium: 8.8 mg/dL — ABNORMAL LOW (ref 8.9–10.3)
Chloride: 106 mmol/L (ref 98–111)
Creatinine, Ser: 0.88 mg/dL (ref 0.61–1.24)
GFR calc Af Amer: >60 mL/min (ref >60)
GFR calc non Af Amer: >60 mL/min (ref >60)
Glucose, Bld: 94 mg/dL (ref 70–99)
Potassium: 3.9 mmol/L (ref 3.5–5.1)
Sodium: 139 mmol/L (ref 135–145)
Total Bilirubin: 0.9 mg/dL (ref 0.3–1.2)
Total Protein: 5.9 g/dL — ABNORMAL LOW (ref 6.5–8.1)

## 2019-11-23 LAB — CK: Total CK: >50000 U/L — ABNORMAL HIGH (ref 49–397)

## 2019-11-23 NOTE — Plan of Care (Signed)
  Problem: Clinical Measurements: Goal: Ability to maintain clinical measurements within normal limits will improve Outcome: Progressing Goal: Diagnostic test results will improve Outcome: Progressing   Problem: Clinical Measurements: Goal: Diagnostic test results will improve Outcome: Progressing   Problem: Activity: Goal: Risk for activity intolerance will decrease Outcome: Progressing   Problem: Elimination: Goal: Will not experience complications related to bowel motility Outcome: Progressing

## 2019-11-23 NOTE — Progress Notes (Signed)
PROGRESS NOTE    William Le  EZM:[id number] DOB: 01/02/1996 DOA: 11/19/2019 PCP: Patient, No Pcp Per   Brief Narrative:23 y.o.malewithno previous past medical historywho is coming to the emergency department due to soreness in his right eye associated with decreased urine output and dark-colored urine. He has been exercising his lower extremities by lifting weights the past couple days. He mentions that he has only had 2-3 bottles of water since then. He denies flank pain, dysuria or frequency. He denies fever, chills, sore throat or rhinorrhea. No dyspnea, wheezing or hemoptysis. Denies chest pain, palpitations, dizziness, diaphoresis, lower extremity edema. Denies abdominal pain, diarrhea, constipation, melena or hematochezia. The patient had an episode of emesis after trying to eat dinner this evening.  ED Course:Initial vital signs temperature 98.2 F, pulse 81, respirations 16, blood pressure 154/39 mmHg and O2 sat 99% on room air. The patient has been given vigorous IVF resuscitation. He has urinated twice since being in the emergency department.  His urinalysis is amber in color with a hazy appearance and a specific gravity of 1.0 31, large hemoglobinuria with no RBCs on microscopic examination. There is ketonuria 5 and proteinuria 100 mg/dL. Rare bacteria on microscopic examination. CMP shows an AST of 848 and ALT of 168.   Assessment & Plan:   Principal Problem:   Rhabdomyolysis Active Problems:   Transaminitis   Bilateral leg pain   Elevated LFTs  #1Rhabdomyolysis-secondary to heavy exercise. His renal functions fortunately is stable. However his CPK is still elevated more than 50,000.He still needs ongoing IV hydration.  Continue IV fluids  250 cc an hour.  His urine has cleared up.  Follow-up labs in a.m. Out of bed ambulate  #2 transaminitis-likely secondary to rhabdomyolysis.[id number] down 1062 down from 1144 from88 up from 848 ALT 276 up  from 268 Total bilirubin 0.6 Albumin 3.6  ultrasound of the liver and gallbladder-marked circumferential gallbladder wall thickening likely due to systemic causes no gallstones no biliary dilatation normal-appearing liver.  hepatitis panelnegative Urine drug screennegative, alcohol levelnegative  Estimated body mass index is 23.06 kg/m as calculated from the following:   Height as of this encounter: 6' (1.829 m).   Weight as of this encounter: 77.1 kg.  DVT prophylaxis:Lovenox  code Status:Full code  family Communication:None  disposition Plan:Patient came from home, plan will be to discharge home. Barriers to discharge-still with active rhabdomyolysis with more than 50,000 CPK and elevated transaminitis, needs IV fluids and close follow-up of renal functions and liver functions. Consultants:  None  Procedures:None Antimicrobials:None   Subjective:  Patient resting in bed he denies any nausea or vomiting encourage him to ambulate Objective: Vitals:   11/22/19 0535 11/22/19 1347 11/22/19 2224 11/23/19 0637  BP: 105/88 132/60 136/66 (!) 141/74  Pulse: 66 (!) 53 (!) 55 (!) 55  Resp: 16 16 16 14   Temp: 98.4 F (36.9 C) 98.5 F (36.9 C) 98.2 F (36.8 C) 98 F (36.7 C)  TempSrc: Oral Oral Oral Oral  SpO2: 100% 100% 100% 100%  Weight:      Height:        Intake/Output Summary (Last 24 hours) at 11/23/2019 1332 Last data filed at 11/23/2019 1002 Gross per 24 hour  Intake 7455.09 ml  Output 5900 ml  Net 1555.09 ml   Filed Weights   11/19/19 1148  Weight: 77.1 kg    Examination:  General exam: Appears calm and comfortable  Respiratory system: Clear to auscultation. Respiratory effort normal. Cardiovascular system: S1 & S2 heard, RRR.  No JVD, murmurs, rubs, gallops or clicks. No pedal edema. Gastrointestinal system: Abdomen is nondistended, soft and nontender. No organomegaly or masses felt. Normal bowel sounds heard. Central nervous system: Alert and  oriented. No focal neurological deficits. Extremities: Symmetric 5 x 5 power. Skin: No rashes, lesions or ulcers Psychiatry: Judgement and insight appear normal. Mood & affect appropriate.     Data Reviewed: I have personally reviewed following labs and imaging studies  CBC: Recent Labs  Lab 11/19/19 1359 11/20/19 0339 11/21/19 0240 11/23/19 0433  WBC 6.6 6.4 5.3 5.3  NEUTROABS 4.4  --   --   --   HGB 13.2 11.0* 11.1* 10.8*  HCT 43.1 35.8* 36.5* 35.1*  MCV 91.1 90.9 91.0 90.7  PLT 286 244 264 177   Basic Metabolic Panel: Recent Labs  Lab 11/19/19 2028 11/20/19 0339 11/21/19 0240 11/22/19 0253 11/23/19 0433  NA 140 140 140 139 139  K 3.9 3.7 4.0 4.0 3.9  CL 109 109 108 106 106  CO2 24 25 27 25 27   GLUCOSE 102* 107* 96 89 94  BUN 6 7 8 8 9   CREATININE 0.87 0.99 0.92 0.87 0.88  CALCIUM 8.4* 8.3* 8.6* 8.7* 8.8*   GFR: Estimated Creatinine Clearance: 142.4 mL/min (by C-G formula based on SCr of 0.88 mg/dL). Liver Function Tests: Recent Labs  Lab 11/19/19 1223 11/20/19 0339 11/21/19 0240 11/22/19 0253 11/23/19 0433  AST 848* 808* 1,144* 1,062* 817*  ALT 168* 180* 268* 276* 271*  ALKPHOS 41 33* 38 32* 33*  BILITOT 1.1 0.9 0.6 0.6 0.9  PROT 7.5 5.6* 6.2* 5.7* 5.9*  ALBUMIN 4.5 3.3* 3.6 3.4* 3.4*   No results for input(s): LIPASE, AMYLASE in the last 168 hours. No results for input(s): AMMONIA in the last 168 hours. Coagulation Profile: No results for input(s): INR, PROTIME in the last 168 hours. Cardiac Enzymes: Recent Labs  Lab 11/19/19 1223 11/20/19 0339 11/21/19 0240 11/22/19 0253 11/23/19 0433  CKTOTAL >50,000* >50,000* >50,000* >50,000* >50,000*   BNP (last 3 results) No results for input(s): PROBNP in the last 8760 hours. HbA1C: No results for input(s): HGBA1C in the last 72 hours. CBG: No results for input(s): GLUCAP in the last 168 hours. Lipid Profile: No results for input(s): CHOL, HDL, LDLCALC, TRIG, CHOLHDL, LDLDIRECT in the last 72  hours. Thyroid Function Tests: No results for input(s): TSH, T4TOTAL, FREET4, T3FREE, THYROIDAB in the last 72 hours. Anemia Panel: No results for input(s): VITAMINB12, FOLATE, FERRITIN, TIBC, IRON, RETICCTPCT in the last 72 hours. Sepsis Labs: No results for input(s): PROCALCITON, LATICACIDVEN in the last 168 hours.  Recent Results (from the past 240 hour(s))  SARS CORONAVIRUS 2 (TAT 6-24 HRS) Nasopharyngeal Nasopharyngeal Swab     Status: None   Collection Time: 11/19/19  1:59 PM   Specimen: Nasopharyngeal Swab  Result Value Ref Range Status   SARS Coronavirus 2 NEGATIVE NEGATIVE Final    Comment: (NOTE) SARS-CoV-2 target nucleic acids are NOT DETECTED. The SARS-CoV-2 RNA is generally detectable in upper and lower respiratory specimens during the acute phase of infection. Negative results do not preclude SARS-CoV-2 infection, do not rule out co-infections with other pathogens, and should not be used as the sole basis for treatment or other patient management decisions. Negative results must be combined with clinical observations, patient history, and epidemiological information. The expected result is Negative. Fact Sheet for Patients: SugarRoll.be Fact Sheet for Healthcare Providers: https://www.woods-Jolinda Pinkstaff.com/ This test is not yet approved or cleared by the [place] FDA and  has been authorized for detection and/or diagnosis of SARS-CoV-2 by FDA under an Emergency Use Authorization (EUA). This EUA will remain  in effect (meaning this test can be used) for the duration of the COVID-19 declaration under Section 56 4(b)(1) of the Act, 21 U.S.C. section 360bbb-3(b)(1), unless the authorization is terminated or revoked sooner. Performed at [facility], [address], [place], [place] [address]          Radiology Studies: No results found.      Scheduled Meds: Continuous Infusions: . sodium chloride 250 mL/hr  at 11/23/19 1153     LOS: 3 days     Alwyn Ren, MD  11/23/2019, 1:32 PM

## 2019-11-23 NOTE — Progress Notes (Signed)
Swelling noted to bilateral upper extremities from forearm to mid upper arm, left greater than right, left AC iv site removed due to possible infiltration, on-call provider sent text page regarding BUE edema since symptom is new to patient, awaiting new iv site placement, both arms elevated on 2 pillows each side, no complaints of pain, + radial pulses bilaterally, fingers warm with good sensation and brisk cap refill, will continue to monitor. 

## 2019-11-24 LAB — CK: Total CK: 41883 U/L — ABNORMAL HIGH (ref 49–397)

## 2019-11-24 MED ORDER — FUROSEMIDE 10 MG/ML IJ SOLN
40.0000 mg | Freq: Once | INTRAMUSCULAR | Status: AC
  Administered 2019-11-24: 40 mg via INTRAVENOUS
  Filled 2019-11-24: qty 4

## 2019-11-24 NOTE — Discharge Instructions (Signed)
Please drink plenty of fluids 4 gallon a day for 1 week Check CMP and CPK levels 11/26/2019 Please follow-up with the doctor at the campus before returning to work   Rhabdomyolysis Rhabdomyolysis is a condition that happens when muscle cells break down and release substances into the blood that can damage the kidneys. This condition happens because of damage to the muscles that move bones (skeletal muscle). When the skeletal muscles are damaged, substances inside the muscle cells go into the blood. One of these substances is a protein called myoglobin. Large amounts of myoglobin can cause kidney damage or kidney failure. Other substances that are released by muscle cells may upset the balance of the minerals (electrolytes) in your blood. This imbalance causes your blood to have too much acid (acidosis). What are the causes? This condition is caused by muscle damage. Muscle damage often happens because of:  Using your muscles too much.  An injury that crushes or squeezes a muscle too tightly.  Using illegal drugs, mainly cocaine.  Alcohol abuse. Other possible causes include:  Prescription medicines, such as those that: ? Lower cholesterol (statins). ? Treat ADHD (attention deficit hyperactivity disorder) or help with weight loss (amphetamines). ? Treat pain (opiates).  Infections.  Muscle diseases that are passed down from parent to child (inherited).  High fever.  Heatstroke.  Not having enough fluids in your body (dehydration).  Seizures.  Surgery. What increases the risk? This condition is more likely to develop in people who:  Have a family history of muscle disease.  Take part in extreme sports, such as running in marathons.  Have diabetes.  Are older.  Abuse drugs or alcohol. What are the signs or symptoms? Symptoms of this condition vary. Some people have very few symptoms, and other people have many symptoms. The most common symptoms include:  Muscle pain  and swelling.  Weak muscles.  Dark urine.  Feeling weak and tired. Other symptoms include:  Nausea and vomiting.  Fever.  Pain in the abdomen.  Pain in the joints. Symptoms of complications from this condition include:  Heart rhythm that is not normal (arrhythmia).  Seizures.  Not urinating enough because of kidney failure.  Very low blood pressure (shock). Signs of shock include dizziness, blurry vision, and clammy skin.  Bleeding that is hard to stop or control. How is this diagnosed? This condition is diagnosed based on your medical history, your symptoms, and a physical exam. Tests may also be done, including:  Blood tests.  Urine tests to check for myoglobin. You may also have other tests to check for causes of muscle damage and to check for complications. How is this treated? Treatment for this condition helps to:  Make sure you have enough fluids in your body.  Lower the acid levels in your blood to reverse acidosis.  Protect your kidneys. Treatment may include:  Fluids and medicines given through an IV tube that is inserted into one of your veins.  Medicines to lower acidosis or to bring back the balance of the minerals in your body.  Hemodialysis. This treatment uses an artificial kidney machine to filter your blood while you recover. You may have this if other treatments are not helping. Follow these instructions at home:   Take over-the-counter and prescription medicines only as told by your health care provider.  Rest at home until your health care provider says that you can return to your normal activities.  Drink enough fluid to keep your urine clear or pale yellow.  Do not do activities that take a lot of effort (are strenuous). Ask your health care provider what level of exercise is safe for you.  Do not abuse drugs or alcohol. If you are having problems with drug or alcohol use, ask your health care provider for help.  Keep all follow-up  visits as told by your health care provider. This is important. Contact a health care provider if:  You start having symptoms of this condition after treatment. Get help right away if:  You have a seizure.  You bleed easily or cannot control bleeding.  You cannot urinate.  You have chest pain.  You have trouble breathing. This information is not intended to replace advice given to you by your health care provider. Make sure you discuss any questions you have with your health care provider. Document Revised: 08/11/2017 Document Reviewed: 06/10/2016 Elsevier Patient Education  2020 [employer].

## 2019-11-24 NOTE — Plan of Care (Signed)
For discharge 

## 2019-11-24 NOTE — Discharge Summary (Signed)
Physician Discharge Summary  William Le JAS:[id number] DOB: November 30, 1995 DOA: 11/19/2019  PCP: Patient, No Pcp Per  Admit date: 11/19/2019 Discharge date: 11/24/2019  Admitted From: Home Disposition: Home Recommendations for Outpatient Follow-up:  1. Follow up with PCP in 1week 2. Please obtain CMP/CBC 3/16  Home Health none Equipment/Devices: None  Discharge Condition: Stable and improved CODE STATUS: Full code Diet recommendation: Cardiac diet  brief/Interim Summary:23 y.o.malewithno previous past medical historywho is coming to the emergency department due to soreness in his right eye associated with decreased urine output and dark-colored urine. He has been exercising his lower extremities by lifting weights the past couple days. He mentions that he has only had 2-3 bottles of water since then. He denies flank pain, dysuria or frequency. He denies fever, chills, sore throat or rhinorrhea. No dyspnea, wheezing or hemoptysis. Denies chest pain, palpitations, dizziness, diaphoresis, lower extremity edema. Denies abdominal pain, diarrhea, constipation, melena or hematochezia. The patient had an episode of emesis after trying to eat dinner this evening.  ED Course:Initial vital signs temperature 98.2 F, pulse 81, respirations 16, blood pressure 154/39 mmHg and O2 sat 99% on room air. The patient has been given vigorous IVF resuscitation. He has urinated twice since being in the emergency department.  His urinalysis is amber in color with a hazy appearance and a specific gravity of 1.0 31, large hemoglobinuria with no RBCs on microscopic examination. There is ketonuria 5 and proteinuria 100 mg/dL. Rare bacteria on microscopic examination. CMP shows an AST of 848 and ALT of 168.   Discharge Diagnoses:  Principal Problem:   Rhabdomyolysis Active Problems:   Transaminitis   Bilateral leg pain   Elevated LFTs   #1Rhabdomyolysis-secondary to heavy exercise.  His CPKs  were over 50,000.  He was treated with IV fluids vigorously.  His CPKs finally started trending down with also LFTs trending down.  He was advised to drink plenty of water on discharge and follow-up with [place] primary care doctor at the campus and check CPK CBC and CMP mid next week.    #2 transaminitis-likely secondary to rhabdomyolysis.LFTs trending down.  ultrasound of the liver and gallbladder-marked circumferential gallbladder wall thickening likely due to systemic causes no gallstones no biliary dilatation normal-appearing liver. hepatitis panelnegative Urine drug screennegative, alcohol levelnegative  Estimated body mass index is 23.06 kg/m as calculated from the following:   Height as of this encounter: 6' (1.829 m).   Weight as of this encounter: 77.1 kg.  Discharge Instructions  Discharge Instructions    Diet - low sodium heart healthy   Complete by: As directed    Increase activity slowly   Complete by: As directed      Allergies as of 11/24/2019   No Known Allergies     Medication List    You have not been prescribed any medications.     No Known Allergies  Consultations: None  Procedures/Studies: US Abdomen Limited RUQ  Result Date: 11/20/2019 CLINICAL DATA:  Elevated LFTs.  Rhabdomyolysis. EXAM: ULTRASOUND ABDOMEN LIMITED RIGHT UPPER QUADRANT COMPARISON:  None. FINDINGS: Gallbladder: Partially distended. Diffuse circumferential gallbladder wall thickening up to 12 mm. No gallstones. No pericholecystic fluid. No sonographic Murphy sign noted by sonographer. Common bile duct: Diameter: 3 mm, normal. Liver: No focal lesion identified. Within normal limits in parenchymal echogenicity. Portal vein is patent on color Doppler imaging with normal direction of blood flow towards the liver. Other: Trace perihepatic fluid.  Right pleural effusion is noted. IMPRESSION: 1. Marked circumferential gallbladder wall thickening  likely due to systemic causes. No gallstones or  sonographic findings of acute cholecystitis. No biliary dilatation. 2. Normal sonographic appearance of the liver. 3. Trace perihepatic free fluid.  Right pleural effusion. Electronically Signed   By: Keith Rake M.D.   On: 11/20/2019 18:07    (Echo, Carotid, EGD, Colonoscopy, ERCP)    Subjective:  Resting in bed mild edema to the upper extremities no shortness of breath nausea vomiting diarrhea abdominal pain or chest pain   discharge Exam: Vitals:   11/23/19 2108 11/24/19 0501  BP: 134/64 (!) 152/57  Pulse: 68 66  Resp: 16 16  Temp: 98.3 F (36.8 C) 98.2 F (36.8 C)  SpO2: 100% 100%   Vitals:   11/23/19 0637 11/23/19 1405 11/23/19 2108 11/24/19 0501  BP: (!) 141/74 133/65 134/64 (!) 152/57  Pulse: (!) 55 (!) 52 68 66  Resp: 14 18 16 16   Temp: 98 F (36.7 C) 98.6 F (37 C) 98.3 F (36.8 C) 98.2 F (36.8 C)  TempSrc: Oral Oral Oral Oral  SpO2: 100% 100% 100% 100%  Weight:      Height:        General: Pt is alert, awake, not in acute distress Cardiovascular: RRR, S1/S2 +, no rubs, no gallops Respiratory: CTA bilaterally, no wheezing, no rhonchi Abdominal: Soft, NT, ND, bowel sounds + Extremities: no edema, no cyanosis    The results of significant diagnostics from this hospitalization (including imaging, microbiology, ancillary and laboratory) are listed below for reference.     Microbiology: Recent Results (from the past 240 hour(s))  SARS CORONAVIRUS 2 (TAT 6-24 HRS) Nasopharyngeal Nasopharyngeal Swab     Status: None   Collection Time: 11/19/19  1:59 PM   Specimen: Nasopharyngeal Swab  Result Value Ref Range Status   SARS Coronavirus 2 NEGATIVE NEGATIVE Final    Comment: (NOTE) SARS-CoV-2 target nucleic acids are NOT DETECTED. The SARS-CoV-2 RNA is generally detectable in upper and lower respiratory specimens during the acute phase of infection. Negative results do not preclude SARS-CoV-2 infection, do not rule out co-infections with other pathogens,  and should not be used as the sole basis for treatment or other patient management decisions. Negative results must be combined with clinical observations, patient history, and epidemiological information. The expected result is Negative. Fact Sheet for Patients: SugarRoll.be Fact Sheet for Healthcare Providers: https://www.woods-Inga Noller.com/ This test is not yet approved or cleared by the [place] FDA and  has been authorized for detection and/or diagnosis of SARS-CoV-2 by FDA under an Emergency Use Authorization (EUA). This EUA will remain  in effect (meaning this test can be used) for the duration of the COVID-19 declaration under Section 56 4(b)(1) of the Act, 21 U.S.C. section 360bbb-3(b)(1), unless the authorization is terminated or revoked sooner. Performed at [facility], [place] [address], [place], [place] [address]      Labs: BNP (last 3 results) No results for input(s): BNP in the last 8760 hours. Basic Metabolic Panel: Recent Labs  Lab 11/19/19 2028 11/20/19 0339 11/21/19 0240 11/22/19 0253 11/23/19 0433  NA 140 140 140 139 139  K 3.9 3.7 4.0 4.0 3.9  CL 109 109 108 106 106  CO2 24 25 27 25 27   GLUCOSE 102* 107* 96 89 94  BUN 6 7 8 8 9   CREATININE 0.87 0.99 0.92 0.87 0.88  CALCIUM 8.4* 8.3* 8.6* 8.7* 8.8*   Liver Function Tests: Recent Labs  Lab 11/19/19 1223 11/20/19 7893 11/21/19 0240 11/22/19 0253 11/23/19 8101  AST 848* 808* 1,144* 1,062* 817*  ALT 168* 180* 268* 276* 271*  ALKPHOS 41 33* 38 32* 33*  BILITOT 1.1 0.9 0.6 0.6 0.9  PROT 7.5 5.6* 6.2* 5.7* 5.9*  ALBUMIN 4.5 3.3* 3.6 3.4* 3.4*   No results for input(s): LIPASE, AMYLASE in the last 168 hours. No results for input(s): AMMONIA in the last 168 hours. CBC: Recent Labs  Lab 11/19/19 1359 11/20/19 0339 11/21/19 0240 11/23/19 0433  WBC 6.6 6.4 5.3 5.3  NEUTROABS 4.4  --   --   --   HGB 13.2 11.0* 11.1* 10.8*  HCT 43.1 35.8* 36.5*  35.1*  MCV 91.1 90.9 91.0 90.7  PLT 286 244 264 229   Cardiac Enzymes: Recent Labs  Lab 11/20/19 0339 11/21/19 0240 11/22/19 0253 11/23/19 0433 11/24/19 0418  CKTOTAL >50,000* >50,000* >50,000* >50,000* 77,412*   BNP: Invalid input(s): POCBNP CBG: No results for input(s): GLUCAP in the last 168 hours. D-Dimer No results for input(s): DDIMER in the last 72 hours. Hgb A1c No results for input(s): HGBA1C in the last 72 hours. Lipid Profile No results for input(s): CHOL, HDL, LDLCALC, TRIG, CHOLHDL, LDLDIRECT in the last 72 hours. Thyroid function studies No results for input(s): TSH, T4TOTAL, T3FREE, THYROIDAB in the last 72 hours.  Invalid input(s): FREET3 Anemia work up No results for input(s): VITAMINB12, FOLATE, FERRITIN, TIBC, IRON, RETICCTPCT in the last 72 hours. Urinalysis    Component Value Date/Time   COLORURINE AMBER (A) 11/19/2019 1153   APPEARANCEUR HAZY (A) 11/19/2019 1153   LABSPEC 1.031 (H) 11/19/2019 1153   PHURINE 5.0 11/19/2019 1153   GLUCOSEU NEGATIVE 11/19/2019 1153   HGBUR LARGE (A) 11/19/2019 1153   BILIRUBINUR NEGATIVE 11/19/2019 1153   KETONESUR 5 (A) 11/19/2019 1153   PROTEINUR 100 (A) 11/19/2019 1153   NITRITE NEGATIVE 11/19/2019 1153   LEUKOCYTESUR NEGATIVE 11/19/2019 1153   Sepsis Labs Invalid input(s): PROCALCITONIN,  WBC,  LACTICIDVEN Microbiology Recent Results (from the past 240 hour(s))  SARS CORONAVIRUS 2 (TAT 6-24 HRS) Nasopharyngeal Nasopharyngeal Swab     Status: None   Collection Time: 11/19/19  1:59 PM   Specimen: Nasopharyngeal Swab  Result Value Ref Range Status   SARS Coronavirus 2 NEGATIVE NEGATIVE Final    Comment: (NOTE) SARS-CoV-2 target nucleic acids are NOT DETECTED. The SARS-CoV-2 RNA is generally detectable in upper and lower respiratory specimens during the acute phase of infection. Negative results do not preclude SARS-CoV-2 infection, do not rule out co-infections with other pathogens, and should not be used  as the sole basis for treatment or other patient management decisions. Negative results must be combined with clinical observations, patient history, and epidemiological information. The expected result is Negative. Fact Sheet for Patients: HairSlick.no Fact Sheet for Healthcare Providers: quierodirigir.com This test is not yet approved or cleared by the [place] FDA and  has been authorized for detection and/or diagnosis of SARS-CoV-2 by FDA under an Emergency Use Authorization (EUA). This EUA will remain  in effect (meaning this test can be used) for the duration of the COVID-19 declaration under Section 56 4(b)(1) of the Act, 21 U.S.C. section 360bbb-3(b)(1), unless the authorization is terminated or revoked sooner. Performed at [facility], [address], [place], [place] [address]      Time coordinating discharge: 37 minutes  SIGNED:   Alwyn Ren, MD  [facility] 11/24/2019, 9:24 AM Pager   If 7PM-7AM, please contact night-coverage [email or web address] Password [email or web address]

## 2020-10-27 IMAGING — US US ABDOMEN LIMITED
1 series · 14 of 25 positions shown · non-contrast
Comparison: None.

CLINICAL DATA: Elevated LFTs.  Rhabdomyolysis.

EXAM:
ULTRASOUND ABDOMEN LIMITED RIGHT UPPER QUADRANT

[Series 1: us abdomen limited · 14 of 106 slices shown]
[im 1/106]
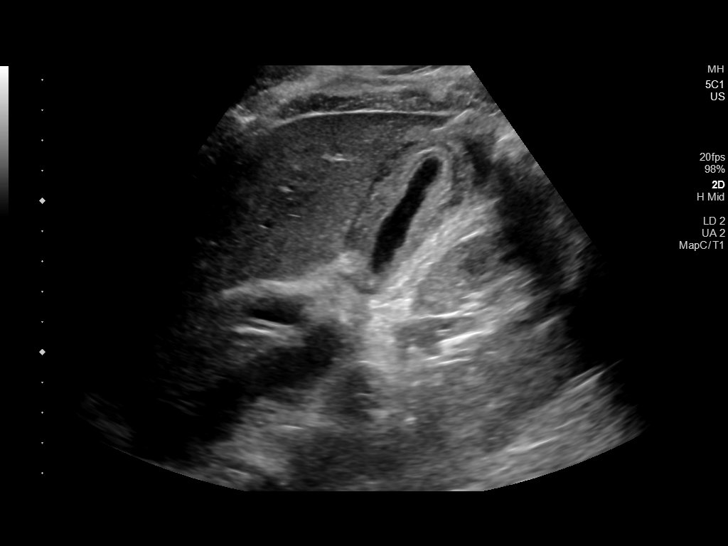
[im 9/106]
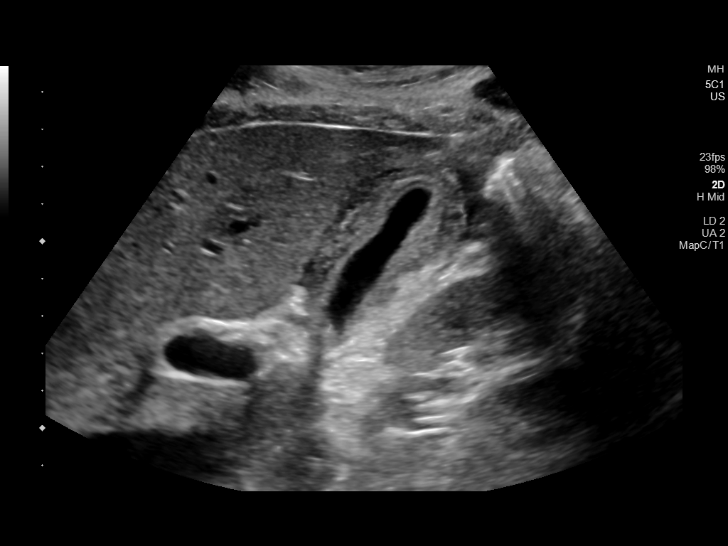
[im 18/106]
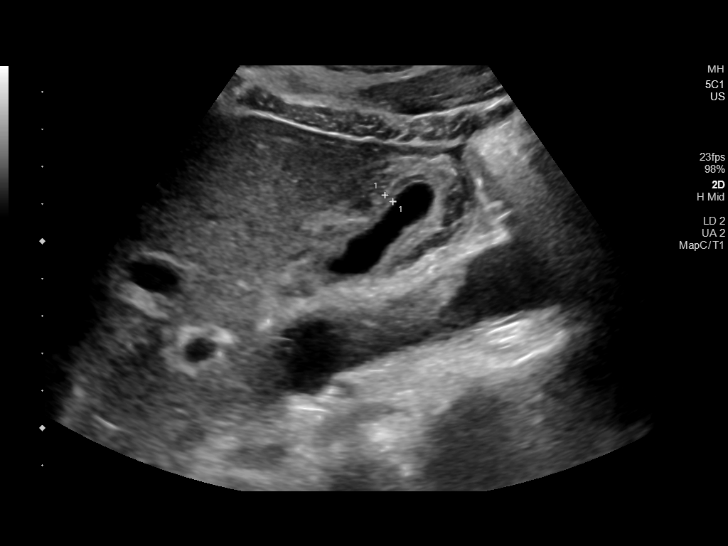
[im 27/106]
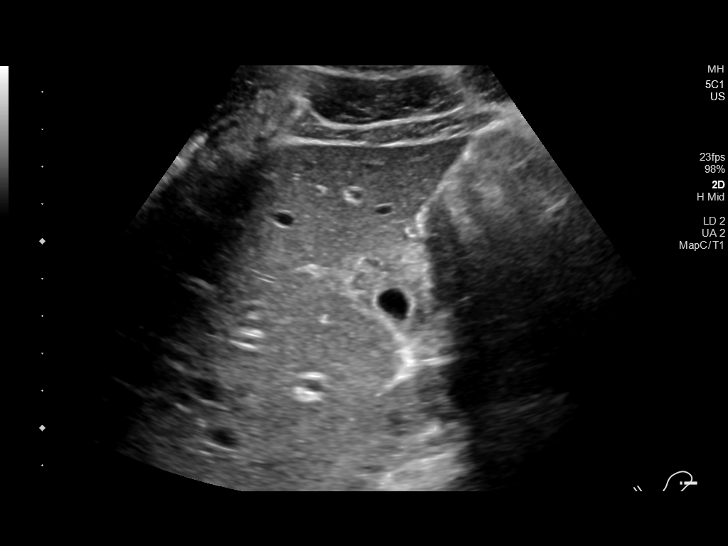
[im 36/106]
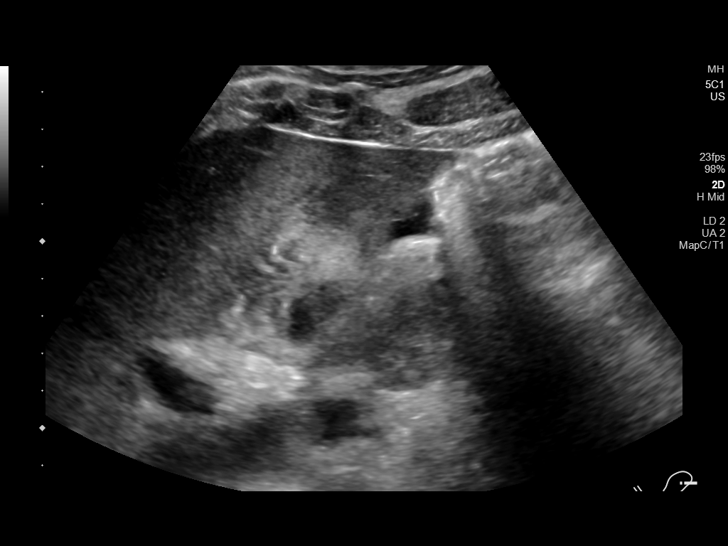
[im 40/106]
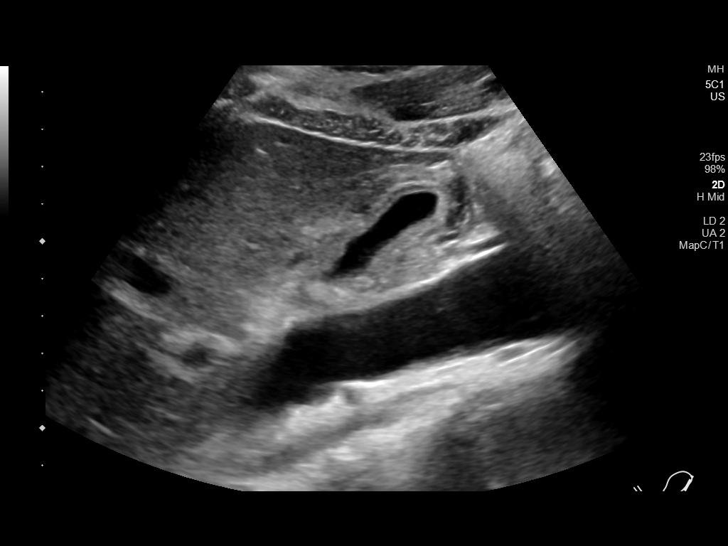
[im 49/106]
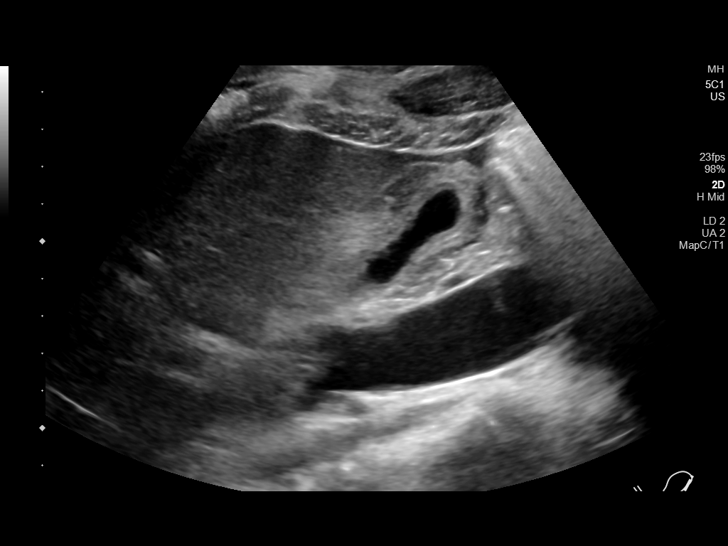
[im 57/106]
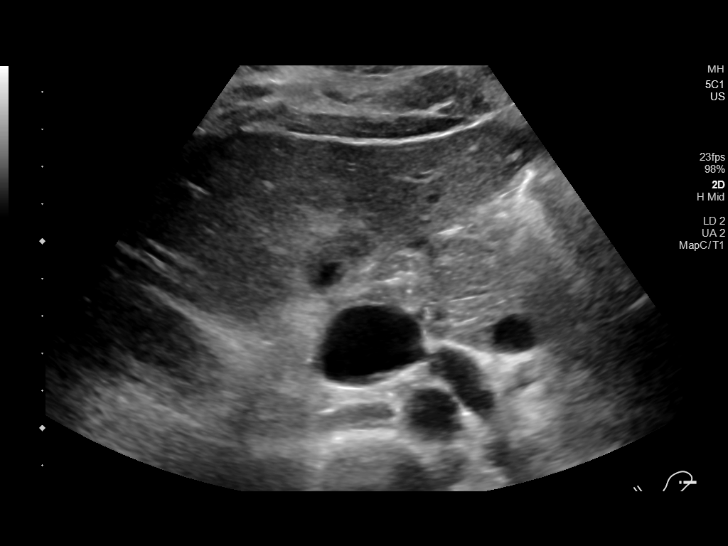
[im 66/106]
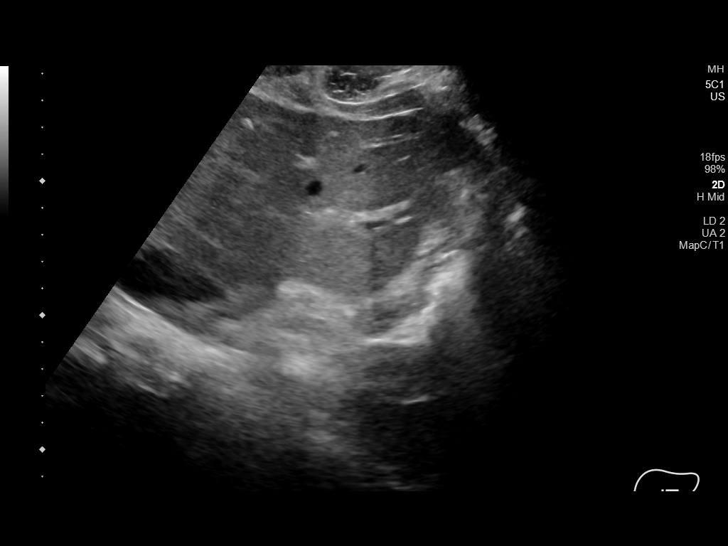
[im 71/106]
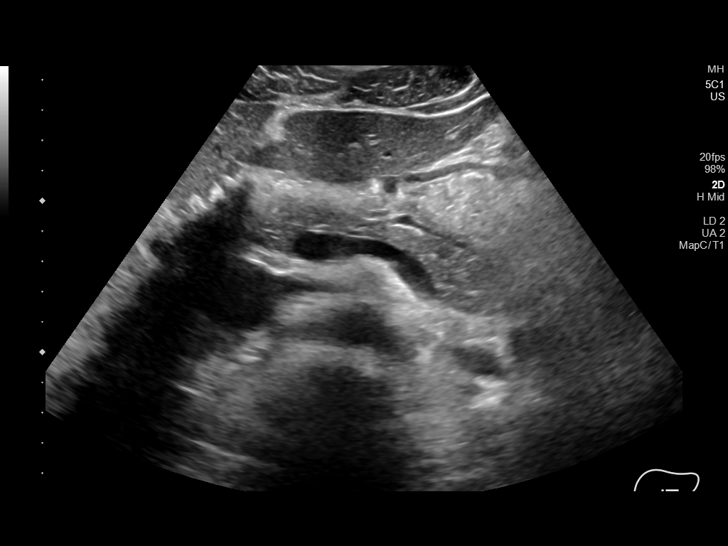
[im 79/106]
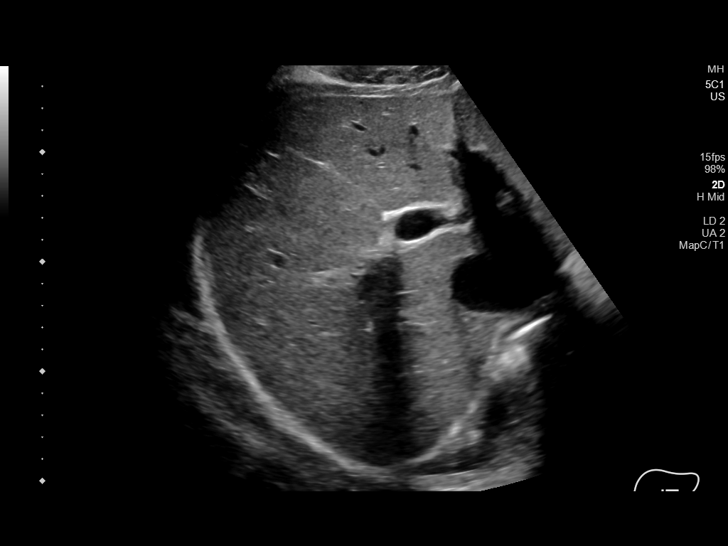
[im 88/106]
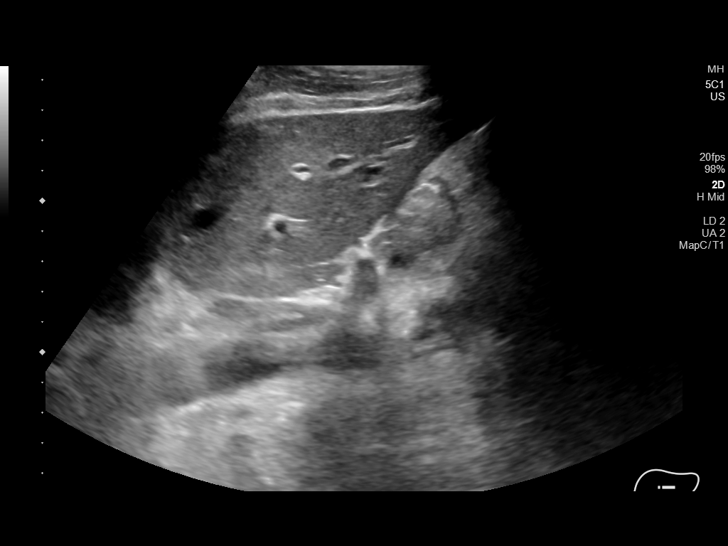
[im 97/106]
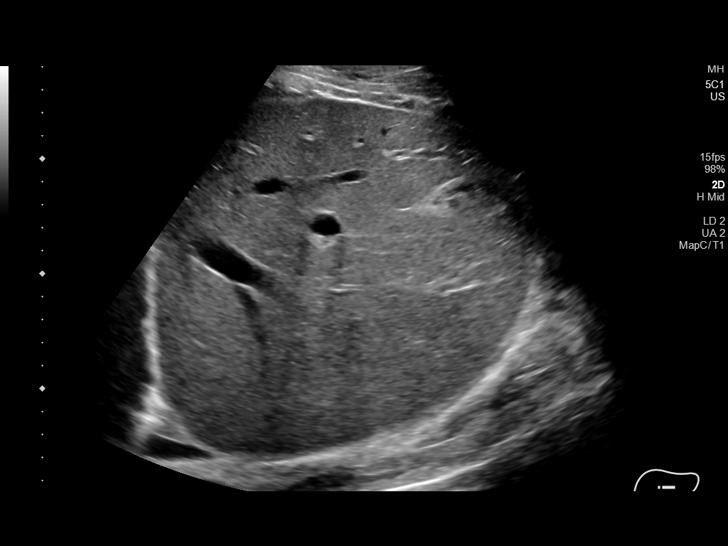
[im 106/106]
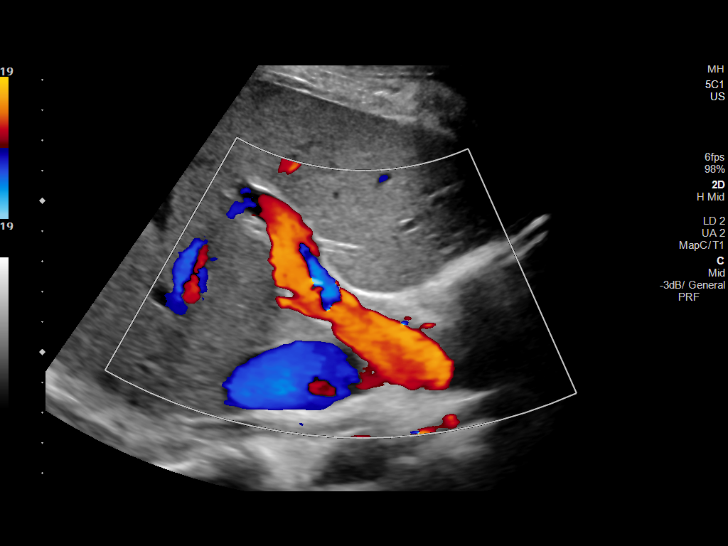

[14 of 25 positions shown; findings below may reference images not displayed]

FINDINGS: Gallbladder:

Partially distended. Diffuse circumferential gallbladder wall
thickening up to 12 mm. No gallstones. No pericholecystic fluid. No
sonographic Murphy sign noted by sonographer.

Common bile duct:

Diameter: 3 mm, normal.

Liver:

No focal lesion identified. Within normal limits in parenchymal
echogenicity. Portal vein is patent on color Doppler imaging with
normal direction of blood flow towards the liver.

Other: Trace perihepatic fluid.  Right pleural effusion is noted.
IMPRESSION: 1. Marked circumferential gallbladder wall thickening likely due to
systemic causes. No gallstones or sonographic findings of acute
cholecystitis. No biliary dilatation.
2. Normal sonographic appearance of the liver.
3. Trace perihepatic free fluid.  Right pleural effusion.

## 2022-12-05 ENCOUNTER — Ambulatory Visit
Admission: EM | Admit: 2022-12-05 | Discharge: 2022-12-05 | Disposition: A | Discharge status: Home / Self Care | Payer: BC Managed Care – PPO | Attending: Physician Assistant | Admitting: Physician Assistant

## 2022-12-05 DIAGNOSIS — S61011A Laceration without foreign body of right thumb without damage to nail, initial encounter: Secondary | ICD-10-CM

## 2022-12-05 NOTE — ED Triage Notes (Signed)
Pt presents with laceration on right thumb after trying to catch a glass from falling early this morning.

## 2022-12-05 NOTE — Discharge Instructions (Signed)
Return if any problems.

## 2022-12-05 NOTE — ED Provider Notes (Signed)
[facility]    CSN: [id number] Arrival date & time: 12/05/22  1004      History   Chief Complaint Chief Complaint  Patient presents with   Laceration    HPI William Le is a 27 y.o. male.   Complains of a laceration to his right thumb patient reports he cut it over a glass at approximately 3 AM this morning  The history is provided by the patient. No language interpreter was used.  Laceration Length:  6 mm Laceration mechanism:  Broken glass Pain details:    Quality:  Aching   Severity:  Moderate   Timing:  Constant Foreign body present:  Glass Relieved by:  Nothing Ineffective treatments:  None tried   History reviewed. No pertinent past medical history.  Patient Active Problem List   Diagnosis Date Noted   Bilateral leg pain    Elevated LFTs    Rhabdomyolysis 11/19/2019   Transaminitis 11/19/2019    History reviewed. No pertinent surgical history.     Home Medications    Prior to Admission medications   Not on File    Family History Family History  Problem Relation Age of Onset   Multiple myeloma Father     Social History Social History   Tobacco Use   Smoking status: Never   Smokeless tobacco: Never  Substance Use Topics   Alcohol use: Never   Drug use: Never     Allergies   Shellfish allergy   Review of Systems Review of Systems  All other systems reviewed and are negative.    Physical Exam Triage Vital Signs ED Triage Vitals  Enc Vitals Group     BP 12/05/22 1211 122/61     Pulse Rate 12/05/22 1211 (!) 57     Resp 12/05/22 1211 18     Temp 12/05/22 1211 98.2 F (36.8 C)     Temp Source 12/05/22 1211 Oral     SpO2 12/05/22 1211 98 %     Weight --      Height --      Head Circumference --      Peak Flow --      Pain Score 12/05/22 1210 4     Pain Loc --      Pain Edu? --      Excl. in [place]? --    No data found.  Updated Vital Signs BP 122/61 (BP Location: Right Arm)   Pulse (!) 57    Temp 98.2 F (36.8 C) (Oral)   Resp 18   SpO2 98%   Visual Acuity Right Eye Distance:   Left Eye Distance:   Bilateral Distance:    Right Eye Near:   Left Eye Near:    Bilateral Near:     Physical Exam Vitals reviewed.  Constitutional:      Appearance: Normal appearance.  Skin:    General: Skin is warm.     Comments: 6 mm laceration right thumb with range of motion neurovascular neurosensory are intact  Neurological:     General: No focal deficit present.     Mental Status: He is alert.  Psychiatric:        Mood and Affect: Mood normal.      UC Treatments / Results  Labs (all labs ordered are listed, but only abnormal results are displayed) Labs Reviewed - No data to display  EKG   Radiology No results found.  Procedures Laceration Repair  Date/Time: 12/05/2022 12:43 PM  Performed by: Fransico Meadow, PA-C Authorized by: Fransico Meadow, PA-C   Consent:    Consent obtained:  Verbal   Consent given by:  Patient   Risks discussed:  Infection Universal protocol:    Procedure explained and questions answered to patient or proxy's satisfaction: yes     Patient identity confirmed:  Verbally with patient Laceration details:    Location:  Finger   Finger location:  R thumb   Length (cm):  0.7 Pre-procedure details:    Preparation:  Patient was prepped and draped in usual sterile fashion Exploration:    Wound exploration: wound explored through full range of motion     Contaminated: no   Treatment:    Area cleansed with:  Povidone-iodine   Visualized foreign bodies/material removed: no     Debridement:  None Skin repair:    Repair method:  Tissue adhesive Repair type:    Repair type:  Simple  (including critical care time)  Medications Ordered in UC Medications - No data to display  Initial Impression / Assessment and Plan / UC Course  I have reviewed the triage vital signs and the nursing notes.  Pertinent labs & imaging results that were  available during my care of the patient were reviewed by me and considered in my medical decision making (see chart for details).     MDM: Does not think that there is any foreign body in the wound.  Patient reports glass broke and large chunks.  I do not see or palpate any foreign body patient is counseled on the possibility of her body Final Clinical Impressions(s) / UC Diagnoses   Final diagnoses:  Laceration of right thumb, foreign body presence unspecified, nail damage status unspecified, initial encounter     Discharge Instructions      Return if any problems.    ED Prescriptions   None    PDMP not reviewed this encounter. An After Visit Summary was printed and given to the patient.    Fransico Meadow, [place] 12/05/22 1245
